# Patient Record
Sex: Male | Born: 1970 | Race: White | Hispanic: No | Marital: Married | Smoking: Never smoker
Health system: Southern US, Academic
[De-identification: ages and names within clinical notes are randomized; demographics above are authoritative.]

## PROBLEM LIST (undated history)

## (undated) DIAGNOSIS — F411 Generalized anxiety disorder: Secondary | ICD-10-CM

## (undated) DIAGNOSIS — I10 Essential (primary) hypertension: Secondary | ICD-10-CM

## (undated) DIAGNOSIS — M109 Gout, unspecified: Secondary | ICD-10-CM

## (undated) DIAGNOSIS — E7800 Pure hypercholesterolemia, unspecified: Secondary | ICD-10-CM

## (undated) DIAGNOSIS — R194 Change in bowel habit: Secondary | ICD-10-CM

## (undated) HISTORY — DX: Change in bowel habit: R19.4

## (undated) HISTORY — DX: Generalized anxiety disorder: F41.1

## (undated) HISTORY — PX: WRIST SURGERY: SHX841

---

## 1994-12-03 ENCOUNTER — Other Ambulatory Visit (HOSPITAL_COMMUNITY): Payer: Self-pay

## 2017-11-10 DIAGNOSIS — M1A09X0 Idiopathic chronic gout, multiple sites, without tophus (tophi): Secondary | ICD-10-CM | POA: Insufficient documentation

## 2021-08-09 ENCOUNTER — Ambulatory Visit (INDEPENDENT_AMBULATORY_CARE_PROVIDER_SITE_OTHER): Payer: Self-pay | Admitting: NURSE PRACTITIONER

## 2021-08-11 ENCOUNTER — Other Ambulatory Visit: Payer: Self-pay

## 2021-08-11 ENCOUNTER — Emergency Department
Admission: EM | Admit: 2021-08-11 | Discharge: 2021-08-11 | Disposition: A | Discharge status: Home / Self Care | Payer: Commercial Managed Care - PPO | Attending: Family | Admitting: Family

## 2021-08-11 ENCOUNTER — Encounter (HOSPITAL_COMMUNITY): Payer: Self-pay | Admitting: Family

## 2021-08-11 ENCOUNTER — Emergency Department (HOSPITAL_COMMUNITY): Payer: Commercial Managed Care - PPO

## 2021-08-11 DIAGNOSIS — R109 Unspecified abdominal pain: Secondary | ICD-10-CM | POA: Insufficient documentation

## 2021-08-11 DIAGNOSIS — K760 Fatty (change of) liver, not elsewhere classified: Secondary | ICD-10-CM | POA: Insufficient documentation

## 2021-08-11 DIAGNOSIS — Z8719 Personal history of other diseases of the digestive system: Secondary | ICD-10-CM

## 2021-08-11 DIAGNOSIS — K649 Unspecified hemorrhoids: Secondary | ICD-10-CM | POA: Insufficient documentation

## 2021-08-11 HISTORY — DX: Pure hypercholesterolemia, unspecified: E78.00

## 2021-08-11 HISTORY — DX: Essential (primary) hypertension: I10

## 2021-08-11 HISTORY — DX: Gout, unspecified: M10.9

## 2021-08-11 LAB — URINALYSIS, MACROSCOPIC
BILIRUBIN: NEGATIVE mg/dL
BLOOD: 0.1 mg/dL — AB
GLUCOSE: NEGATIVE mg/dL
KETONES: NEGATIVE mg/dL
LEUKOCYTES: NEGATIVE WBCs/uL
NITRITE: NEGATIVE
PH: 5 (ref 5.0–9.0)
PROTEIN: NEGATIVE mg/dL
SPECIFIC GRAVITY: 1.018 (ref 1.002–1.030)
UROBILINOGEN: NORMAL mg/dL

## 2021-08-11 LAB — CBC WITH DIFF
BASOPHIL #: 0 10*3/uL (ref 0.00–0.30)
BASOPHIL %: 0 % (ref 0–3)
EOSINOPHIL #: 0.3 10*3/uL (ref 0.00–0.80)
EOSINOPHIL %: 4 % (ref 0–7)
HCT: 42.6 % (ref 42.0–51.0)
HGB: 14.5 g/dL (ref 13.5–18.0)
LYMPHOCYTE #: 1.8 10*3/uL (ref 1.10–5.00)
LYMPHOCYTE %: 20 % — ABNORMAL LOW (ref 25–45)
MCH: 29.9 pg (ref 27.0–32.0)
MCHC: 34 g/dL (ref 32.0–36.0)
MCV: 88.1 fL (ref 78.0–99.0)
MONOCYTE #: 0.6 10*3/uL (ref 0.00–1.30)
MONOCYTE %: 7 % (ref 0–12)
MPV: 7.9 fL (ref 7.4–10.4)
NEUTROPHIL #: 6.3 10*3/uL (ref 1.80–8.40)
NEUTROPHIL %: 70 % (ref 40–76)
PLATELETS: 236 10*3/uL (ref 140–440)
RBC: 4.84 10*6/uL (ref 4.20–6.00)
RDW: 13.3 % (ref 11.6–14.8)
WBC: 9 10*3/uL (ref 4.0–10.5)
WBCS UNCORRECTED: 9 10*3/uL

## 2021-08-11 LAB — COMPREHENSIVE METABOLIC PANEL, NON-FASTING
ALBUMIN/GLOBULIN RATIO: 2.1 — ABNORMAL HIGH (ref 0.8–1.4)
ALBUMIN: 4.5 g/dL (ref 3.5–5.7)
ALKALINE PHOSPHATASE: 72 U/L (ref 34–104)
ALT (SGPT): 29 U/L (ref 7–52)
ANION GAP: 9 mmol/L — ABNORMAL LOW (ref 10–20)
AST (SGOT): 21 U/L (ref 13–39)
BILIRUBIN TOTAL: 1.3 mg/dL — ABNORMAL HIGH (ref 0.3–1.2)
BUN/CREA RATIO: 17 (ref 6–22)
BUN: 18 mg/dL (ref 7–25)
CALCIUM, CORRECTED: 8.8 mg/dL — ABNORMAL LOW (ref 8.9–10.8)
CALCIUM: 9.3 mg/dL (ref 8.6–10.3)
CHLORIDE: 108 mmol/L — ABNORMAL HIGH (ref 98–107)
CO2 TOTAL: 22 mmol/L (ref 21–31)
CREATININE: 1.08 mg/dL (ref 0.60–1.30)
ESTIMATED GFR: 85 mL/min/{1.73_m2} (ref >59)
GLOBULIN: 2.1 — ABNORMAL LOW (ref 2.9–5.4)
GLUCOSE: 96 mg/dL (ref 74–109)
OSMOLALITY, CALCULATED: 279 mOsm/kg (ref 270–290)
POTASSIUM: 4.2 mmol/L (ref 3.5–5.1)
PROTEIN TOTAL: 6.6 g/dL (ref 6.4–8.9)
SODIUM: 139 mmol/L (ref 136–145)

## 2021-08-11 LAB — GRAY TOP TUBE

## 2021-08-11 LAB — LAVENDER TOP TUBE

## 2021-08-11 LAB — URINALYSIS, MICROSCOPIC: RBCS: <1 /hpf (ref <4)

## 2021-08-11 LAB — GOLD TOP TUBE

## 2021-08-11 LAB — BLUE TOP TUBE

## 2021-08-11 LAB — LIPASE: LIPASE: 25 U/L (ref 11–82)

## 2021-08-11 NOTE — ED Provider Notes (Signed)
[facility]  ED Primary Provider Note  History of Present Illness   Chief Complaint   Patient presents with   . Abdominal Pain     Arrival: The patient arrived by Car    Greg Booth is a 51 y.o. male who had concerns including Abdominal Pain.  51 year old male presents to the emergency room for evaluation of right-sided abdominal pain x7 days.  Patient also notes he has had multiple loose stool per day.  States he did have a small amount of bright red blood on the toilet paper and feels that is due to hemorrhoids.  Nothing seems to make signs and symptoms better or worse.  Patient denies fever, chills, dysuria, black stool, nausea, and vomiting.  He has no complaints of flank pain or radiating pain.  He describes the abdominal pain as a dull ache.  He has no other complaints this time.  Pain is currently rated at 4/10    Review of Systems   All other systems reviewed and are negative except as noted.    Historical Data   History Reviewed This Encounter: Medical History  Surgical History  Family History  Social History      Physical Exam   ED Triage Vitals [08/11/21 0811]   BP (Non-Invasive) (!) 139/100   Heart Rate 75   Respiratory Rate 18   Temperature 36.4 C (97.5 F)   SpO2 98 %   Weight 104 kg (230 lb)   Height 1.727 m (5' 8")       Constitutional:  51 y.o. male who appears in no distress. Normal color, no cyanosis.   HENT:   Head: Normocephalic and atraumatic.   Mouth/Throat: Oropharynx is clear and moist.   Eyes: EOMI, PERRL   Neck: Trachea midline. Neck supple.  Cardiovascular: RRR, S1, S2.  No murmurs, rubs or gallops. Intact distal pulses.  Pulmonary/Chest: BS clear and equal bilaterally.  Respirations even and nonlabored.  No respiratory distress. No wheezes, rales or chest tenderness.   Abdominal: Bowel sounds present and normal. Abdomen soft,  no rebound and no guarding. Mild right side abd pain. Hemorrhoid noted on examination, small with no active bleeding. RN,  Wells Guiles, chaperone at bedside  Back: No midline spinal tenderness, no paraspinal tenderness, no CVA tenderness.           Musculoskeletal: No edema, tenderness or deformity.  Skin: warm and dry. No rash, erythema, pallor or cyanosis  Psychiatric: normal mood and affect. Behavior is normal.   Neurological: Patient keenly alert and responsive, easily able to raise eyebrows, facial muscles/expressions symmetric, speaking in fluent sentences, moving all extremities equally and fully, normal gait  Patient Data     Labs Ordered/Reviewed   COMPREHENSIVE METABOLIC PANEL, NON-FASTING - Abnormal; Notable for the following components:       Result Value    CHLORIDE 108 (*)     ANION GAP 9 (*)     BILIRUBIN TOTAL 1.3 (*)     ALBUMIN/GLOBULIN RATIO 2.1 (*)     CALCIUM, CORRECTED 8.8 (*)     GLOBULIN 2.1 (*)     All other components within normal limits    Narrative:     Estimated Glomerular Filtration Rate (eGFR) is calculated using the CKD-EPI (2021) equation, intended for patients 41 years of age and older. If gender is not documented or "unknown", there will be no eGFR calculation.   CBC WITH DIFF - Abnormal; Notable for the following components:  LYMPHOCYTE % 20 (*)     All other components within normal limits   URINALYSIS, MACROSCOPIC - Abnormal; Notable for the following components:    BLOOD 0.1 (*)     All other components within normal limits   URINALYSIS, MICROSCOPIC - Abnormal; Notable for the following components:    MUCOUS Rare (*)     All other components within normal limits   LIPASE - Normal   CBC/DIFF    Narrative:     The following orders were created for panel order CBC/DIFF.  Procedure                               Abnormality         Status                     ---------                               -----------         ------                     CBC WITH DIFF[[id number]]                Abnormal            Final result                 Please view results for these tests on the individual orders.    URINALYSIS, MACROSCOPIC AND MICROSCOPIC W/CULTURE REFLEX    Narrative:     The following orders were created for panel order URINALYSIS, MACROSCOPIC AND MICROSCOPIC W/CULTURE REFLEX.  Procedure                               Abnormality         Status                     ---------                               -----------         ------                     URINALYSIS, MACROSCOPIC[[id number]]      Abnormal            Final result               URINALYSIS, MICROSCOPIC[[id number]]      Abnormal            Final result                 Please view results for these tests on the individual orders.   EXTRA TUBES    Narrative:     The following orders were created for panel order EXTRA TUBES.  Procedure                               Abnormality         Status                     ---------                               -----------         ------  BLUE TOP ZOXW[[id number]]                                    In process                 GOLD TOP JXBJ[[id number]]                                    In process                 LAVENDER TOP HYQM[[id number]]                                In process                 Bolan                                    In process                   Please view results for these tests on the individual orders.   BLUE TOP TUBE   GOLD TOP TUBE   LAVENDER TOP TUBE   GRAY TOP TUBE     CT ABDOMEN PELVIS WO IV CONTRAST   Final Result by Edi, Radresults In (05/27 0923)   NO ACUTE FINDINGS AT THE ABDOMEN OR PELVIS ON NONCONTRAST CT.          One or more dose reduction techniques were used (e.g., Automated exposure control, adjustment of the mA and/or kV according to patient size, use of iterative reconstruction technique).         Radiologist location ID: [place] [facility]        Medical Decision Making  Will discharge patient home due to no significant/urgent findings requiring admission.  CT shows fatty liver, patient has bili of 1.3.  He has  no elevation white blood cell count, no anemia, and normal kidney function and electrolytes.  Patient was instructed to follow-up with his primary care doctor in 2-3 days for recheck.  We did discuss preventative diagnostics to as well such as colonoscopy.  Patient notified to avoid taking Tylenol regularly at home and to only use if needed    Fatty liver: acute illness or injury  Hemorrhoids, unspecified hemorrhoid type: acute illness or injury  Right lateral abdominal pain: acute illness or injury  Amount and/or Complexity of Data Reviewed  Labs: ordered. Decision-making details documented in ED Course.  Radiology: ordered. Decision-making details documented in ED Course.      Critical Care  Total time providing critical care: 0 minutes    ED Course as of 08/11/21 1127   Sat Aug 11, 2021   1041 Pending CMP and lipase for disposition.            Clinical Impression   Right lateral abdominal pain (Primary)   Fatty liver   Hemorrhoids, unspecified hemorrhoid type       Disposition: Discharged

## 2021-08-11 NOTE — ED Nurses Note (Signed)
Rounding completed at this time. Patient updated on plan of care. All questions and concerns acknowledged and answered. Patient resting comfortably, denies any needs or complaints at this time. Call light within reach.

## 2021-08-11 NOTE — ED Nurses Note (Signed)
Patient medically evaluated and deemed appropriate for discharge by provider. AVS reviewed with patient/caregiver and copy of all instructions given. Questions sufficiently answered as needed. Patient acknowledged understanding of all instructions. Patient encouraged to follow up with PCP as indicated. In the event of an emergency, patient instructed to call 911 or go to the nearest emergency room. All belongings in patient/caregivers possession at time of discharge. Patient leaving unit ambulatory.

## 2021-08-11 NOTE — Discharge Instructions (Addendum)
FOLLOW UP WITH THE PRIMARY CARE PROVIDER AS SOON AS POSSIBLE BUT NO LATER THAN 3 DAYS NOW.  IF NO PRIMARY CARE PROVIDER EXISTS, THEN THE PATIENT IS INSTRUCTED TO ESTABLISH CARE WITH A PRIMARY CARE PROVIDER. FOLLOW-UP WITH ANY SPECIALIST PROVIDER AS INDICATED AS SOON AS POSSIBLE BUT NO LATER THAN 3 DAYS.  NOTIFY THE PRIMARY CARE PROVIDER THAT YOU WERE IN THE EMERGENCY DEPARTMENT WITHIN 24 HOURS OF DISCHARGE TO FOLLOW-UP ON YOUR RESULTS AND/OR TREATMENTS.  RETURN TO THE EMERGENCY DEPARTMENT IMMEDIATELY IF NEEDED, NO BETTER, WORSE, NEW SYMPTOMS ARISE, OR YOU CANNOT FOLLOW-UP WITH YOUR PRIMARY CARE PROVIDER IN THE PRESCRIBED TIMEFRAME.

## 2021-08-11 NOTE — ED Triage Notes (Signed)
RLQ  Pain x5 days increased to constant pain since last night with loose stools.

## 2021-08-16 ENCOUNTER — Encounter (INDEPENDENT_AMBULATORY_CARE_PROVIDER_SITE_OTHER): Payer: Self-pay | Admitting: Internal Medicine

## 2021-08-16 ENCOUNTER — Other Ambulatory Visit: Payer: Self-pay

## 2021-08-16 ENCOUNTER — Ambulatory Visit (INDEPENDENT_AMBULATORY_CARE_PROVIDER_SITE_OTHER): Payer: Self-pay | Admitting: NURSE PRACTITIONER

## 2021-08-16 ENCOUNTER — Ambulatory Visit (INDEPENDENT_AMBULATORY_CARE_PROVIDER_SITE_OTHER): Payer: Commercial Managed Care - PPO | Admitting: Internal Medicine

## 2021-08-16 VITALS — BP 138/72 | HR 74 | Resp 18 | Ht 68.0 in | Wt 232.4 lb

## 2021-08-16 DIAGNOSIS — K760 Fatty (change of) liver, not elsewhere classified: Secondary | ICD-10-CM | POA: Insufficient documentation

## 2021-08-16 DIAGNOSIS — R194 Change in bowel habit: Secondary | ICD-10-CM

## 2021-08-16 DIAGNOSIS — F411 Generalized anxiety disorder: Secondary | ICD-10-CM | POA: Insufficient documentation

## 2021-08-16 DIAGNOSIS — E7800 Pure hypercholesterolemia, unspecified: Secondary | ICD-10-CM | POA: Insufficient documentation

## 2021-08-16 DIAGNOSIS — I10 Essential (primary) hypertension: Secondary | ICD-10-CM

## 2021-08-16 DIAGNOSIS — R319 Hematuria, unspecified: Secondary | ICD-10-CM

## 2021-08-16 DIAGNOSIS — M109 Gout, unspecified: Secondary | ICD-10-CM | POA: Insufficient documentation

## 2021-08-16 MED ORDER — RAMIPRIL 10 MG CAPSULE
10.0000 mg | ORAL_CAPSULE | Freq: Two times a day (BID) | ORAL | 2 refills | Status: DC
Start: 2021-08-16 — End: 2021-11-14

## 2021-08-16 NOTE — Progress Notes (Signed)
INTERNAL MEDICINE, [facility]  [address]  [place] [address]       Name: Greg Booth MRN:  [id number]   Date: 08/16/2021 Age: 51 y.o.       Chief Complaint:    Chief Complaint   Patient presents with   . Emergency Room Follow Up     Pt was seen in [facility] er for abdominal pain test done everything was normal labs ct scan         HPI:  Greg Booth is a 51 y.o. male who presents to the office today via follow-up from his visit with abdominal pain in the emergency department.  He is having abdominal pain he describes as along the right side of his abdomen and it is a dull aching sensation that is intermittent in nature.  Denied any fevers or chills associated with this he denies any chest pain or chest pressure no cough with productive sputum or nausea or vomiting.  He denies any melena but he does have some bleeding whenever he wipes and he states that he has some external hemorrhoids in the past and thinks that this is just secondary to some bowel habit changes that he is had over the last 2-3 weeks.  He states that during this time he has had some more looser stool and he is unsure as to its etiology.  He denies any changes in medications with this and he denies any other issues.  He has noticed also that his blood pressures been elevated slightly as his systolic pressure tends to remain in the mid to upper 130s most of the time.  He is wanting to know if there is anything that could be done with this.  He is denying any headaches and he is otherwise asymptomatic.        Past Medical History:  Past Medical History:   Diagnosis Date   . Change in bowel habits    . Generalized anxiety disorder    . Gout, unspecified    . High cholesterol    . HTN (hypertension)          No past surgical history on file.   Current Outpatient Medications   Medication Sig   . allopurinoL (ZYLOPRIM) 300 mg Oral Tablet 400 mg daily - one tablet of 300 mg and 1 tablet of 100 mg daily   . atorvastatin  (LIPITOR) 40 mg Oral Tablet Take 1 Tablet (40 mg total) by mouth   . metoprolol succinate (TOPROL-XL) 25 mg Oral Tablet Sustained Release 24 hr Take 1 Tablet (25 mg total) by mouth   . ramipriL (ALTACE) 10 mg Oral Capsule Take 1 Capsule (10 mg total) by mouth     Allergies   Allergen Reactions   . Penicillins Rash   . Sulfa (Sulfonamides) Rash       Family History:  Family Medical History:     Problem Relation (Age of Onset)    Diabetes Mother, Father    Gout Mother, Father    Hypertension (High Blood Pressure) Mother, Father    Renal Tumor Father            Social History:   Social History     Tobacco Use   Smoking Status Never   . Passive exposure: Never   Smokeless Tobacco Never     Social History     Substance and Sexual Activity   Alcohol Use Yes    Comment: "Occasional"  Social History     Occupational History   . Not on file       Review of Systems:  Review of systems as discussed in HPI    Problem List:  Patient Active Problem List   Diagnosis   . Gout, unspecified   . High cholesterol   . HTN (hypertension)   . Generalized anxiety disorder   . 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   Constitutional:       General: He is not in acute distress.     Appearance: Normal appearance.   HENT:      Head: Normocephalic.      Right Ear: Tympanic membrane normal.      Left Ear: Tympanic membrane normal.      Nose: Nose normal.      Mouth/Throat:      Mouth: Mucous membranes are moist.   Eyes:      General: No scleral icterus.     Extraocular Movements: Extraocular movements intact.      Conjunctiva/sclera: Conjunctivae normal.      Pupils: Pupils are equal, round, and reactive to light.   Neck:      Vascular: No carotid bruit.    Cardiovascular:      Rate and Rhythm: Normal rate and regular rhythm.      Pulses: Normal pulses.           Dorsalis pedis pulses are 2+ on the right side and 2+ on the left side.        Posterior tibial pulses are 2+ on the right side and 2+ on the left side.      Heart sounds: Normal heart sounds.   Pulmonary:      Effort: Pulmonary effort is normal.      Breath sounds: Normal breath sounds. No wheezing, rhonchi or rales.   Abdominal:      General: Abdomen is flat. Bowel sounds are normal.      Tenderness: There is no abdominal tenderness. There is no guarding.   Musculoskeletal:         General: No swelling. Normal range of motion.      Cervical back: Normal range of motion. No tenderness.      Right lower leg: No edema.      Left lower leg: No edema.   Skin:     General: Skin is warm.      Capillary Refill: Capillary refill takes less than 2 seconds.   Neurological:      General: No focal deficit present.      Mental Status: He is alert and oriented to person, place, and time.      Cranial Nerves: No cranial nerve deficit.          Data Reviewed:  COMPLETE BLOOD COUNT   Lab Results   Component Value Date    WBC 9.0 08/11/2021    HGB 14.5 08/11/2021    HCT 42.6 08/11/2021    PLTCNT 236 08/11/2021       DIFFERENTIAL  Lab Results   Component Value Date    PMNS 44  08/11/2021    LYMPHOCYTES 20 (L) 08/11/2021    MONOCYTES 7 08/11/2021    EOSINOPHIL 4 08/11/2021    BASOPHILS 0 08/11/2021    BASOPHILS 0.00 08/11/2021    PMNABS 6.30 08/11/2021    LYMPHSABS 1.80 08/11/2021    EOSABS 0.30 08/11/2021    MONOSABS 0.60 08/11/2021     BASIC METABOLIC PANEL  Lab Results   Component Value Date    SODIUM 139 08/11/2021    POTASSIUM 4.2 08/11/2021    CHLORIDE 108 (H) 08/11/2021    CO2 22 08/11/2021    ANIONGAP 9 (L) 08/11/2021    BUN 18 08/11/2021    CREATININE 1.08 08/11/2021    BUNCRRATIO 17 08/11/2021    GFR 85 08/11/2021    CALCIUM 9.3 08/11/2021    GLUCOSENF 96 08/11/2021      COMPREHENSIVE METABOLIC PANEL FASTING  Lab  Results   Component Value Date    SODIUM 139 08/11/2021    POTASSIUM 4.2 08/11/2021    CHLORIDE 108 (H) 08/11/2021    CO2 22 08/11/2021    ANIONGAP 9 (L) 08/11/2021    BUN 18 08/11/2021    CREATININE 1.08 08/11/2021    CALCIUM 9.3 08/11/2021    ALBUMIN 4.5 08/11/2021    TOTALPROTEIN 6.6 08/11/2021    ALKPHOS 72 08/11/2021    AST 21 08/11/2021    ALT 29 08/11/2021     COMPREHENSIVE METABOLIC PANEL - NON FASTING  Lab Results   Component Value Date    SODIUM 139 08/11/2021    POTASSIUM 4.2 08/11/2021    CHLORIDE 108 (H) 08/11/2021    CO2 22 08/11/2021    ANIONGAP 9 (L) 08/11/2021    BUN 18 08/11/2021    CREATININE 1.08 08/11/2021    GLUCOSENF 96 08/11/2021    CALCIUM 9.3 08/11/2021    ALBUMIN 4.5 08/11/2021    TOTALPROTEIN 6.6 08/11/2021    ALKPHOS 72 08/11/2021    AST 21 08/11/2021    ALT 29 08/11/2021     THYROID STIMULATING HORMONE  No results found for: TSH, [id number]  URIC ACID  No results found for: URICACID      No results found for this or any previous visit (from the past [id number] hour(s)).      No results found for: URINECX             No results found for this or any previous visit (from the past [id number] hour(s)).  No results found for this or any previous visit (from the past [id number] hour(s)).       Health Maintenance:  Health Maintenance   Topic Date Due   . Hepatitis B Vaccine (1 of 3 - 3-dose series) Never done   . Covid-19 Vaccine (1) Never done   . HIV Screening  Never done   . Adult Tdap-Td (1 - Tdap) Never done   . Colonoscopy  Never done   . Annual Wellness Visit  Never done   . Influenza Vaccine (Season Ended) 11/16/2021   . Depression Screening  08/17/2022   . Meningococcal Vaccine  Aged Out   . Pneumococcal Vaccine, Age 64-64  Aged Out        Assessment:      ICD-10-CM    1. Change in bowel habits  R19.4       2. Gout of multiple sites, unspecified cause, unspecified chronicity  M10.9       3. High cholesterol  E78.00       4. Primary hypertension  I10       5. Generalized anxiety disorder   F41.1       6. 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  Otherwise his labs were essentially negative.  It does have a very minimally elevated bilirubin 1.3 and this may be the culprit for his microscopic hematuria that was seen on dipstick.  The problem is that he is having a changing in his bowel habits and the fact that he is having some abdominal pain in the CT scan of the abdomen and pelvis performed in the emergency department was essentially negative I am going to send him to surgery for a colonoscopy.  He will continue with his current treatment plans otherwise except I am going to increase his ramipril to 10 mg twice daily and see if this will help with his underlying systolic hypertension.  I have told him that we will follow-up with him in about 3 months and I am going to order a repeat urinalysis and the order has been sent and he is aware to go to the hospital to get this collected to be sure that the hematuria has resolved.    No orders of the defined types were placed in this encounter.    Follow up:  No follow-ups on file.    This note was partially created using voice recognition software and is inherently subject to errors including those of syntax and "sound alike " substitutions which may escape proof reading.  In such instances, original meaning may be extrapolated by contextual derivation.    Lucia Gaskins, DO  08/16/2021, 10:37

## 2021-08-31 ENCOUNTER — Encounter (INDEPENDENT_AMBULATORY_CARE_PROVIDER_SITE_OTHER): Payer: Self-pay | Admitting: Family

## 2021-08-31 ENCOUNTER — Telehealth: Payer: 59 | Admitting: Family

## 2021-08-31 DIAGNOSIS — J329 Chronic sinusitis, unspecified: Secondary | ICD-10-CM

## 2021-08-31 MED ORDER — PROMETHAZINE 6.25 MG/5 ML ORAL SYRUP
6.2500 mg | ORAL_SOLUTION | Freq: Four times a day (QID) | ORAL | 0 refills | Status: DC | PRN
Start: 2021-08-31 — End: 2021-10-03

## 2021-08-31 MED ORDER — LEVOFLOXACIN 500 MG TABLET
500.0000 mg | ORAL_TABLET | Freq: Every day | ORAL | 0 refills | Status: DC
Start: 2021-08-31 — End: 2021-10-03

## 2021-08-31 MED ORDER — PREDNISONE 20 MG TABLET
20.0000 mg | ORAL_TABLET | Freq: Every day | ORAL | 0 refills | Status: DC
Start: 2021-08-31 — End: 2021-09-06

## 2021-08-31 NOTE — Progress Notes (Signed)
[facility], [facility]  [address]  [place] [address]    Telephone Visit    Name:  Greg Booth MRN: [id number]   Date:  08/31/2021 Age:   51 y.o.     The patient/family initiated a request for telephone service.  Verbal consent for this service was obtained from the patient/family.    Last office visit in this department: 08/16/2021      Reason for call: sinus pressure and congestion  Call notes:  Having complaints of sinus pressure and congestion since Saturday.  No fever.  Also having nose bleeds which has resolved now.  Drainage is thick yellow.  Some cough.      ICD-10-CM    1. Sinusitis, unspecified chronicity, unspecified location  J32.9           Total provider time spent with the patient on the phone 5 minutes.    Henderson Baltimore, FNP-BC

## 2021-09-06 ENCOUNTER — Telehealth (INDEPENDENT_AMBULATORY_CARE_PROVIDER_SITE_OTHER): Payer: Self-pay | Admitting: Family

## 2021-09-06 ENCOUNTER — Other Ambulatory Visit (INDEPENDENT_AMBULATORY_CARE_PROVIDER_SITE_OTHER): Payer: Self-pay | Admitting: Family

## 2021-09-06 DIAGNOSIS — J069 Acute upper respiratory infection, unspecified: Secondary | ICD-10-CM

## 2021-09-06 MED ORDER — DOXYCYCLINE HYCLATE 100 MG TABLET
100.0000 mg | ORAL_TABLET | Freq: Two times a day (BID) | ORAL | 0 refills | Status: DC
Start: 2021-09-06 — End: 2021-10-03

## 2021-09-06 MED ORDER — PREDNISONE 20 MG TABLET
20.0000 mg | ORAL_TABLET | Freq: Every day | ORAL | 0 refills | Status: DC
Start: 2021-09-06 — End: 2021-10-03

## 2021-09-06 NOTE — Telephone Encounter (Signed)
Pt is not better. What to do?

## 2021-09-11 ENCOUNTER — Ambulatory Visit (INDEPENDENT_AMBULATORY_CARE_PROVIDER_SITE_OTHER): Payer: 59 | Admitting: Surgery

## 2021-09-11 ENCOUNTER — Encounter (INDEPENDENT_AMBULATORY_CARE_PROVIDER_SITE_OTHER): Payer: Self-pay | Admitting: Surgery

## 2021-09-11 ENCOUNTER — Other Ambulatory Visit: Payer: Self-pay

## 2021-09-11 VITALS — BP 111/59 | HR 88 | Temp 97.7°F | Resp 18 | Ht 68.0 in | Wt 233.0 lb

## 2021-09-11 DIAGNOSIS — Z1211 Encounter for screening for malignant neoplasm of colon: Secondary | ICD-10-CM

## 2021-09-11 MED ORDER — SUTAB 1.479-0.188-0.225 GRAM TABLET
ORAL_TABLET | ORAL | 0 refills | Status: DC
Start: 2021-09-11 — End: 2021-10-03

## 2021-09-13 NOTE — Patient Instructions (Signed)
Screening colonoscopy 

## 2021-09-13 NOTE — H&P (Signed)
GENERAL SURGERY, [facility]  [address]  [place] [address]    History and Physical     Name: Greg Booth MRN:  [id number]   Date: 09/11/2021 Age: 51 y.o.            Reason for Visit: Colonoscopy (Screening colon )    History of Present Illness  Greg Booth presents today for screening colonoscopy.  The patient was seen in the emergency room and a CT scan of the abdomen was performed which showed fatty liver.  The patient denies any issues with constipation whereas on occasion he may have some loose bowel movements but nothing significant.  The patient does have history of hemorrhoids.    Negative diabetes, blood thinner, family history of colon cancer      Review of the result(s) of each unique test:  Patient underwent diagnostic testing ( CT scan as above ) prior to this dates visit.  I have personally reviewed the results and that serves as a component of the medical decision making for this encounter       Review of prior external note(s) from each unique source:  Patients referral to this office including a recent assessment by the referring provider.  This was reviewed by me for this unique office visit for the indication and intent of the referral as well as any pertinent medical or surgical history relevant to the patients independent evaluation by me today.      Patient History  Past Medical History:   Diagnosis Date   . Change in bowel habits    . Generalized anxiety disorder    . Gout, unspecified    . High cholesterol    . HTN (hypertension)          Past Surgical History was reviewed and is negative for Any other medical illnesses      Current Outpatient Medications   Medication Sig   . allopurinoL (ZYLOPRIM) 300 mg Oral Tablet 400 mg daily - one tablet of 300 mg and 1 tablet of 100 mg daily   . atorvastatin (LIPITOR) 40 mg Oral Tablet Take 1 Tablet (40 mg total) by mouth   . doxycycline 100 mg Oral Tablet Take 1 Tablet (100 mg total) by mouth Twice daily   .  levoFLOXacin (LEVAQUIN) 500 mg Oral Tablet Take 1 Tablet (500 mg total) by mouth Once a day   . metoprolol succinate (TOPROL-XL) 25 mg Oral Tablet Sustained Release 24 hr Take 1 Tablet (25 mg total) by mouth   . predniSONE (DELTASONE) 20 mg Oral Tablet Take 1 Tablet (20 mg total) by mouth Once a day   . promethazine (PHENERGAN) 6.25 mg/5 mL Oral Syrup Take 5 mL (6.25 mg total) by mouth Every 6 hours as needed for Nausea/Vomiting   . ramipriL (ALTACE) 10 mg Oral Capsule Take 1 Capsule (10 mg total) by mouth Twice daily   . sod sulf-pot chloride-mag sulf (SUTAB) 1.479-0.188- 0.225 gram Oral Tablet Take 12 pills at 10 am to 10:30 am. Take 12 pills at 6 pm to 6:30 pm.     Allergies   Allergen Reactions   . Penicillins Rash   . Sulfa (Sulfonamides) Rash     Family Medical History:     Problem Relation (Age of Onset)    Diabetes Mother, Father    Gout Mother, Father    Hypertension (High Blood Pressure) Mother, Father    Renal Tumor Father  Social History     Tobacco Use   . Smoking status: Never     Passive exposure: Never   . Smokeless tobacco: Never   Vaping Use   . Vaping Use: Never used   Substance Use Topics   . Alcohol use: Yes     Comment: "Occasional"   . Drug use: Never            Physical Examination:  Vitals:    09/11/21 1459   BP: (!) 111/59   Pulse: 88   Resp: 18   Temp: 36.5 C (97.7 F)   SpO2: 97%   Weight: 106 kg (233 lb)   Height: 1.727 m (5\' 8" )   BMI: 35.5        General: appropriate for age. in no acute distress.    Vital signs are present above and have been reviewed by me     HEENT: Atraumatic, Normocephalic. PERRLA. EOMI. Nose clear. Throat clear    Lungs: Nonlabored breathing with symmetric expansion. Clear to auscultation bilaterally    Heart:Regular wth respect to rate and rythmn.    Abdomen:Soft. Nontender. Nondistended and benign    Extremities: Grossly normal. No major deformities     Neuro:  Grossly normal motor and sensory function    Psychiatric: Alert and oriented to person,  place, and time. affect appropriate      Assessment and Plan  Colonoscopy scheduled for 11/28/2021 at 12:00 p.m. noon      Follow Up:  No follow-ups on file.      ICD-10-CM    1. Encounter for screening colonoscopy  Z12.11           Kacee Sukhu B Milano Rosevear, MD ,MBA,FACS    I appreciate the opportunity to be involved in the care of your patients.  If you have any questions or concerns regarding this encounter, please do not hesitate to contact me at your convenience.      This note may have been partially generated using MModal Fluency Direct system, and there may be some incorrect words, spellings, and punctuation that were not noted in checking the note before saving, though effort was made to avoid such errors.

## 2021-10-03 ENCOUNTER — Other Ambulatory Visit: Payer: Self-pay

## 2021-10-03 ENCOUNTER — Ambulatory Visit (INDEPENDENT_AMBULATORY_CARE_PROVIDER_SITE_OTHER): Payer: 59 | Admitting: Family

## 2021-10-03 ENCOUNTER — Encounter (INDEPENDENT_AMBULATORY_CARE_PROVIDER_SITE_OTHER): Payer: Self-pay | Admitting: Family

## 2021-10-03 VITALS — BP 112/76 | HR 74 | Ht 68.0 in | Wt 236.0 lb

## 2021-10-03 DIAGNOSIS — R1011 Right upper quadrant pain: Secondary | ICD-10-CM

## 2021-10-03 DIAGNOSIS — K760 Fatty (change of) liver, not elsewhere classified: Secondary | ICD-10-CM

## 2021-10-03 DIAGNOSIS — L739 Follicular disorder, unspecified: Secondary | ICD-10-CM

## 2021-10-03 MED ORDER — DOXYCYCLINE HYCLATE 100 MG CAPSULE
100.0000 mg | ORAL_CAPSULE | Freq: Two times a day (BID) | ORAL | 0 refills | Status: DC
Start: 2021-10-03 — End: 2022-04-02

## 2021-10-03 NOTE — Progress Notes (Signed)
[facility], [facility]  [address]  [place] [address]       Name: Greg Booth MRN:  [id number]   Date: 10/03/2021 Age: 51 y.o.       Chief Complaint:    Chief Complaint   Patient presents with   . Abdominal Pain     Stomach pain on the right side, was seen in the ER about a month ago. He is having a colonoscopy in September. Said the pain has moved more to under his ribs.  York Spaniel this is a different pain.          HPI:  Greg Booth is a 51 y.o. male who presents with complaints of ruq pain for the alst 3-4 months.  Was seen in the er and had a ct.  No nausea, vomiting or change in his bowels.  Has seen dr duremdes and will have a colonoscopy in September.  Having issues with folliculitis.          Past Medical History:  Past Medical History:   Diagnosis Date   . Change in bowel habits    . Generalized anxiety disorder    . Gout, unspecified    . High cholesterol    . HTN (hypertension)          History reviewed. No pertinent surgical history.   Current Outpatient Medications   Medication Sig   . allopurinoL (ZYLOPRIM) 300 mg Oral Tablet 400 mg daily - one tablet of 300 mg and 1 tablet of 100 mg daily   . atorvastatin (LIPITOR) 40 mg Oral Tablet Take 1 Tablet (40 mg total) by mouth   . doxycycline hyclate (VIBRAMYCIN) 100 mg Oral Capsule Take 1 Capsule (100 mg total) by mouth Twice daily   . metoprolol succinate (TOPROL-XL) 25 mg Oral Tablet Sustained Release 24 hr Take 1 Tablet (25 mg total) by mouth   . ramipriL (ALTACE) 10 mg Oral Capsule Take 1 Capsule (10 mg total) by mouth Twice daily     Allergies   Allergen Reactions   . Penicillins Rash   . Sulfa (Sulfonamides) Rash       Family History:  Family Medical History:     Problem Relation (Age of Onset)    Diabetes Mother, Father    Gout Mother, Father    Hypertension (High Blood Pressure) Mother, Father    Renal Tumor Father            Social History:   Social History     Tobacco Use   Smoking Status Never   . Passive  exposure: Never   Smokeless Tobacco Never     Social History     Substance and Sexual Activity   Alcohol Use Yes    Comment: "Occasional"     Social History     Occupational History   . Not on file       Review of Systems:  Review of systems as discussed in HPI    Problem List:  Patient Active Problem List   Diagnosis   . Gout, unspecified   . High cholesterol   . HTN (hypertension)   . Generalized anxiety disorder   . Nonalcoholic fatty liver disease without nonalcoholic steatohepatitis (NASH)       Physical Examination:  BP 112/76   Pulse 74   Ht 1.727 m (5\' 8" )   Wt 107 kg (236 lb)   SpO2 97%   BMI 35.88 kg/m  Physical Exam  Constitutional:       General: He is not in acute distress.     Appearance: Normal appearance. He is normal weight. He is not diaphoretic.   HENT:      Head: Normocephalic.      Right Ear: Tympanic membrane normal.      Left Ear: Tympanic membrane normal.      Nose: Nose normal.      Mouth/Throat:      Mouth: Mucous membranes are moist.      Pharynx: Oropharynx is clear.   Eyes:      Pupils: Pupils are equal, round, and reactive to light.   Cardiovascular:      Rate and Rhythm: Normal rate and regular rhythm.   Pulmonary:      Breath sounds: Normal breath sounds.   Abdominal:      General: Bowel sounds are normal.      Palpations: Abdomen is soft.   Musculoskeletal:         General: Normal range of motion.      Cervical back: Normal range of motion and neck supple.   Skin:     General: Skin is warm and dry.   Neurological:      Mental Status: He is alert.   Psychiatric:         Mood and Affect: Mood normal.         Behavior: Behavior normal.         Thought Content: Thought content normal.         Judgment: Judgment normal.        Health Maintenance:  Health Maintenance   Topic Date Due   . Hepatitis B Vaccine (1 of 3 - 3-dose series) Never done   . Covid-19 Vaccine (1) Never done   . HIV Screening  Never done   . Adult Tdap-Td (1 - Tdap) Never done   . Colonoscopy  Never done   .  Influenza Vaccine (1) 11/16/2021   . Depression Screening  08/17/2022   . Meningococcal Vaccine  Aged Out   . Pneumococcal Vaccine, Age 21-64  Aged Out        Assessment:    ICD-10-CM    1. RUQ pain  R10.11 US GALLBLADDER     NUC CHOLESCINTIGRAPHY HEPATO IMAGING W EF      2. Nonalcoholic fatty liver disease without nonalcoholic steatohepatitis (NASH)  K76.0 US LIVER      3. Folliculitis  L73.9            Plan:  Will obtain u/s and HIDA for ruq pain.  Will obtain liver u/s to assess fatty liver.  Will try doxycycline for folliculitis.    Orders Placed This Encounter   . US GALLBLADDER   . NUC CHOLESCINTIGRAPHY HEPATO IMAGING W EF   . US LIVER   . doxycycline hyclate (VIBRAMYCIN) 100 mg Oral Capsule              Follow up:  Return as scheduled.    This note was partially created using voice recognition software and is inherently subject to errors including those of syntax and "sound alike " substitutions which may escape proof reading.  In such instances, original meaning may be extrapolated by contextual derivation.    [address] [place], FNP-BC  10/03/2021, 15:24

## 2021-10-04 NOTE — Addendum Note (Signed)
Addended by: Jeannine Boga on: 10/04/2021 12:16 PM     Modules accepted: Orders

## 2021-10-05 ENCOUNTER — Telehealth (HOSPITAL_COMMUNITY): Payer: Self-pay | Admitting: Internal Medicine

## 2021-10-05 NOTE — Telephone Encounter (Signed)
[name] NOTIFIED PT OF ABDOMEN US COMPLETE AT [facility] ON 10/22/2021 AT 9:30 AND HIDA SCAN AT [facility] ON 10/24/2021 AT 9  FAXED AND MAILED  MELISSA

## 2021-10-15 ENCOUNTER — Other Ambulatory Visit: Payer: Self-pay

## 2021-10-22 IMAGING — US US ABDOMEN COMPLETE
1 series · 14 of 25 positions shown · non-contrast
Comparison: None available.

﻿EXAM:  06033   US ABDOMEN COMPLETE
INDICATION: Right upper quadrant abdominal pain.

[Series 1: us abdomen complete · 14 of 60 slices shown]
[im 1/60]
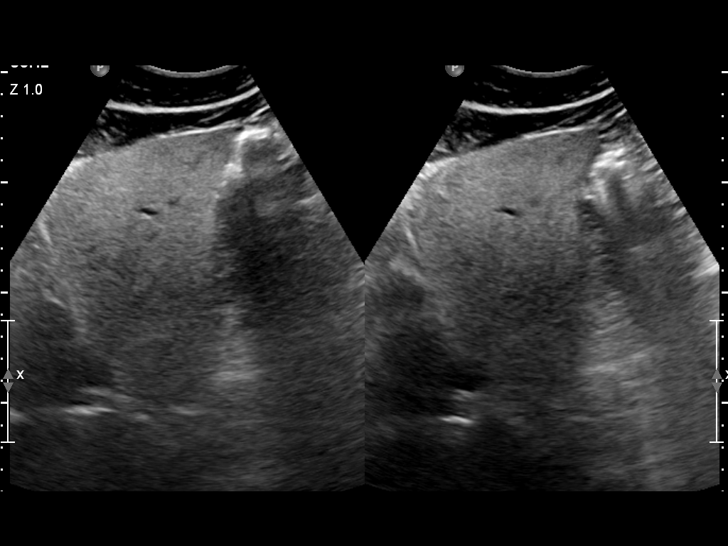
[im 5/60]
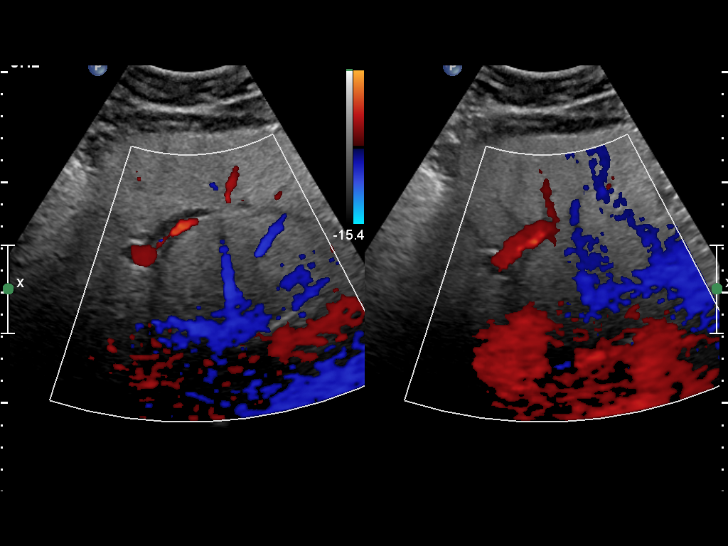
[im 10/60]
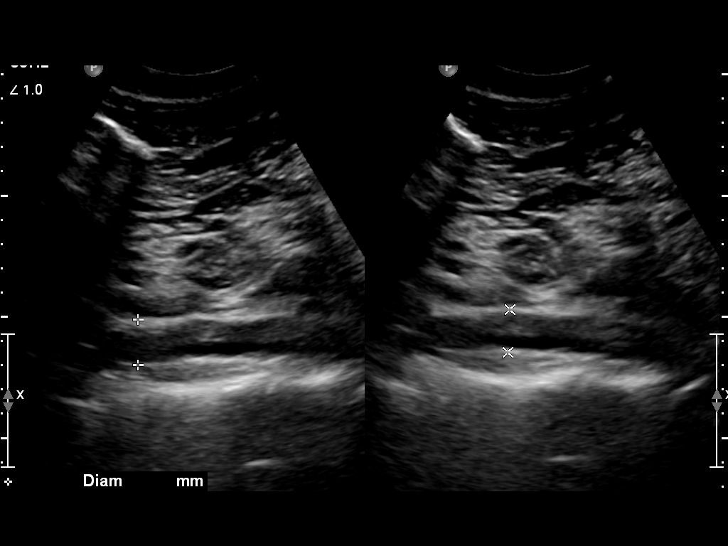
[im 15/60]
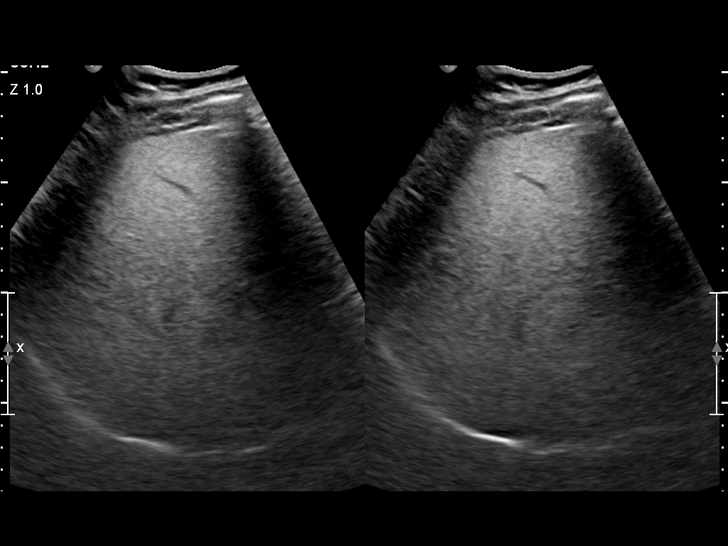
[im 20/60]
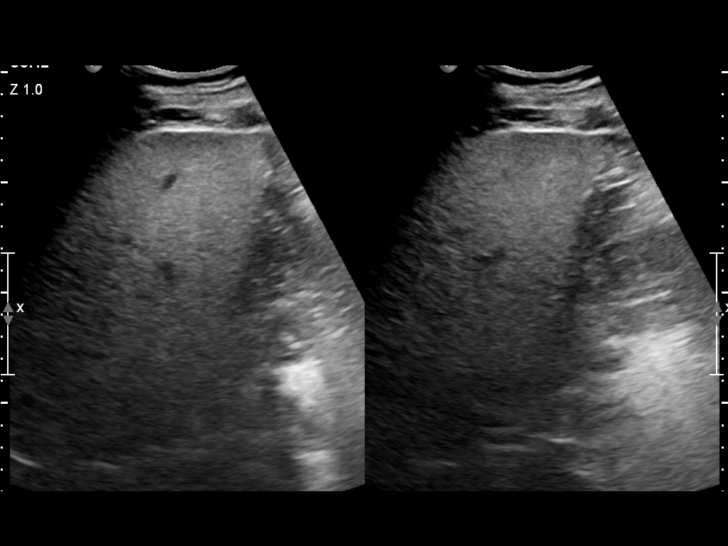
[im 23/60]
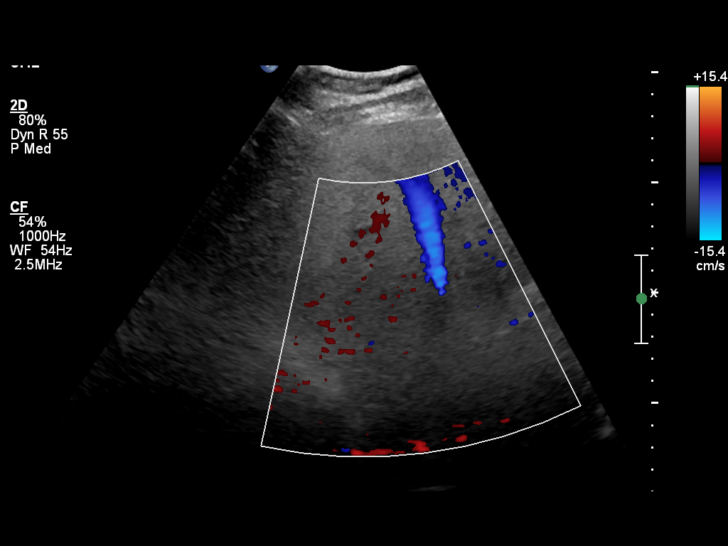
[im 28/60]
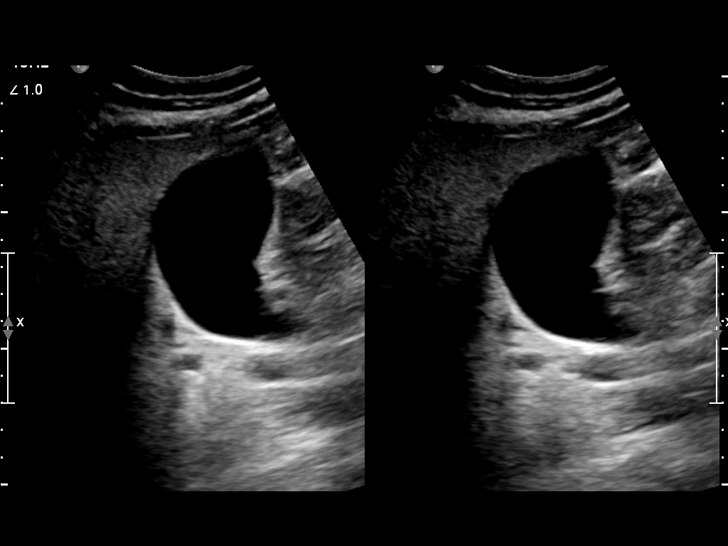
[im 32/60]
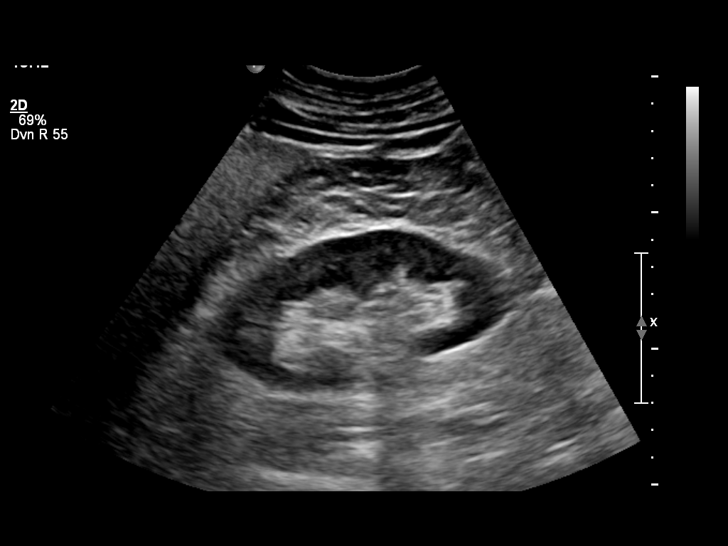
[im 37/60]
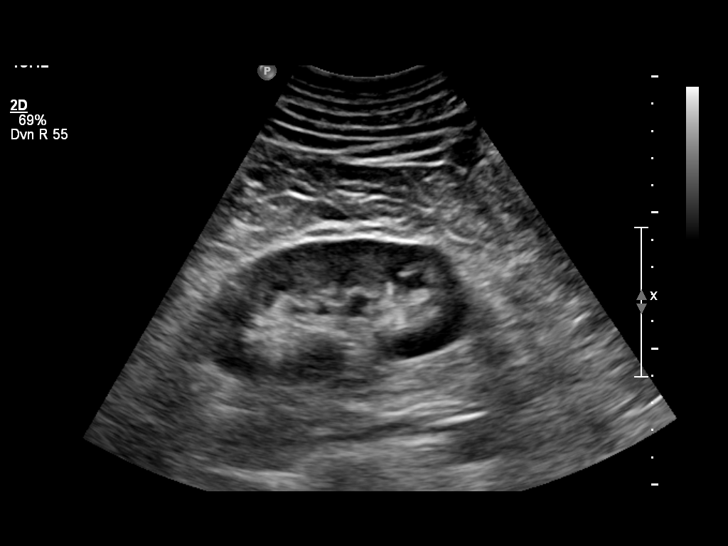
[im 40/60]
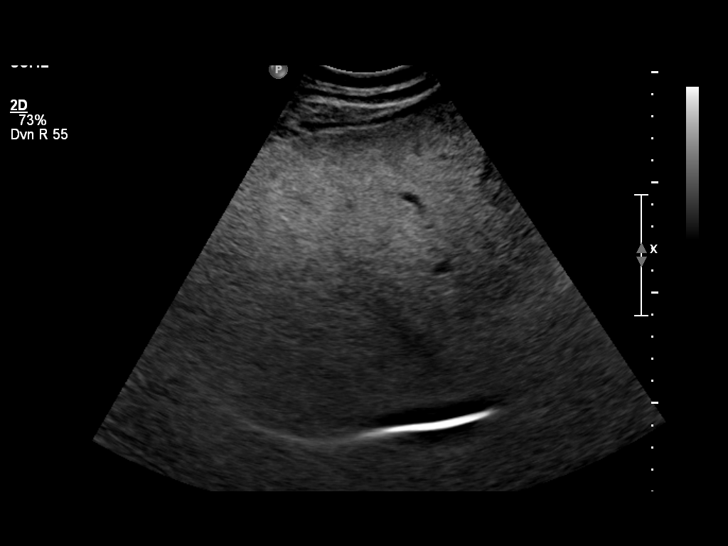
[im 45/60]
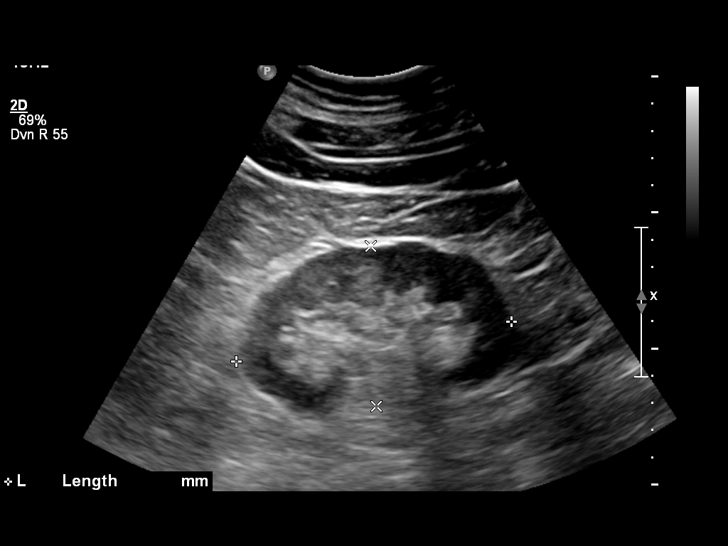
[im 50/60]
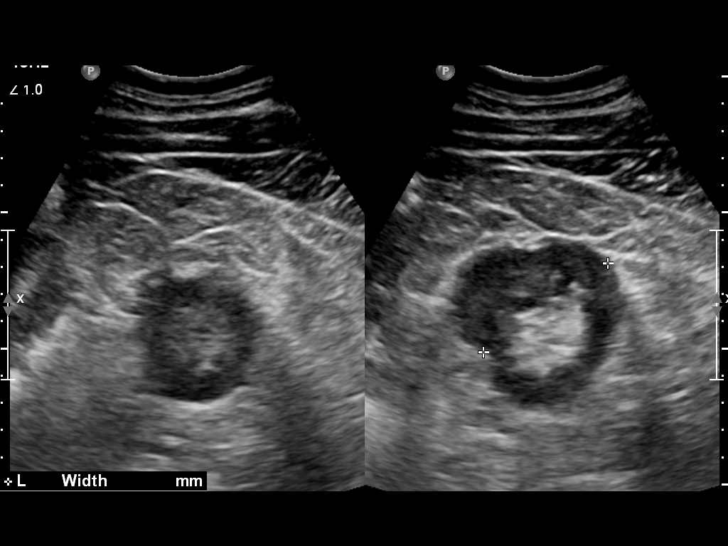
[im 55/60]
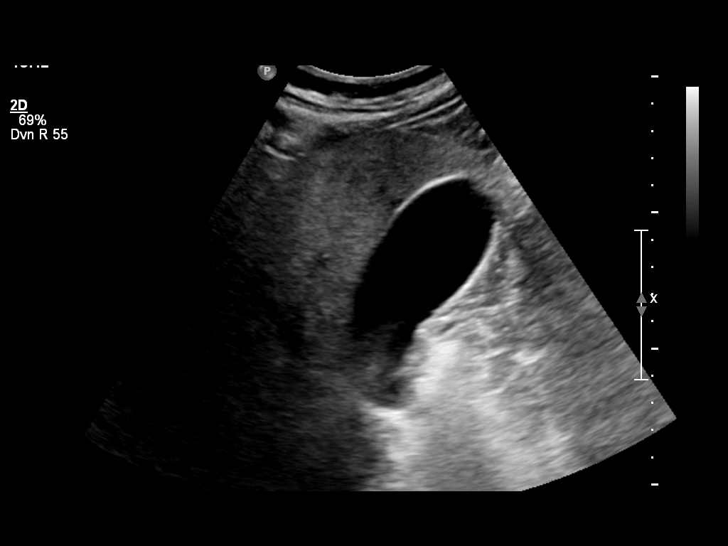
[im 60/60]
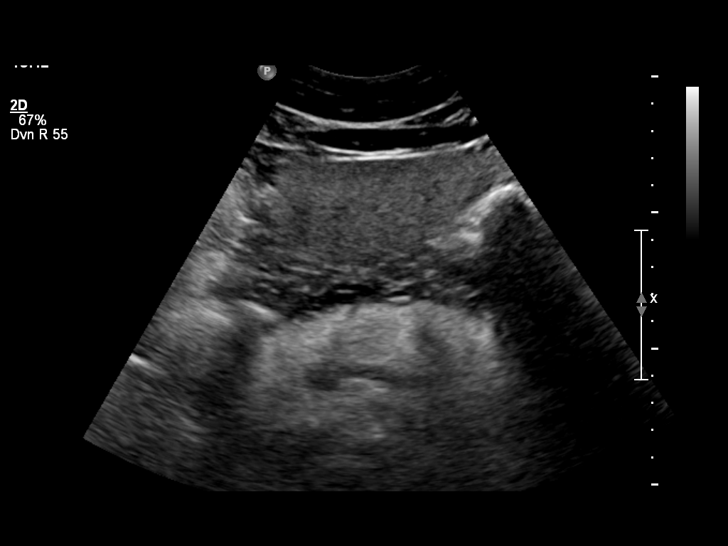

[14 of 25 positions shown; findings below may reference images not displayed]

FINDINGS: The liver demonstrates slightly increased diffuse echogenicity compatible with diffuse fatty liver infiltration.  There is no focal hepatic mass or biliary dilatation.  The common duct measures 6 mm in diameter. 

The gallbladder appears unremarkable with no evidence of cholelithiasis, gallbladder wall thickening, or pericholecystic fluid.  

The visualized pancreas, inferior vena cava, and aorta appear within normal limits.  Both kidneys appear normal in size and appearance with no evidence of hydronephrosis or nephrolithiasis.
IMPRESSION: Diffuse fatty liver infiltration.

## 2021-10-24 ENCOUNTER — Other Ambulatory Visit: Payer: Self-pay

## 2021-10-24 ENCOUNTER — Inpatient Hospital Stay
Admission: RE | Admit: 2021-10-24 | Discharge: 2021-10-24 | Disposition: A | Discharge status: Home / Self Care | Payer: 59 | Source: Ambulatory Visit | Attending: Family | Admitting: Family

## 2021-10-24 DIAGNOSIS — R1011 Right upper quadrant pain: Secondary | ICD-10-CM | POA: Insufficient documentation

## 2021-10-24 MED ORDER — SINCALIDE 5 MCG SOLUTION FOR INJECTION
INTRAMUSCULAR | Status: AC
Start: 2021-10-24 — End: 2021-10-24
  Filled 2021-10-24: qty 5

## 2021-10-24 MED ORDER — SINCALIDE 5 MCG SOLUTION FOR INJECTION
2.1000 ug | INTRAMUSCULAR | Status: DC
Start: 2021-10-24 — End: 2021-10-25

## 2021-10-29 ENCOUNTER — Encounter (INDEPENDENT_AMBULATORY_CARE_PROVIDER_SITE_OTHER): Payer: Self-pay

## 2021-10-29 DIAGNOSIS — K828 Other specified diseases of gallbladder: Secondary | ICD-10-CM

## 2021-10-29 NOTE — Telephone Encounter (Signed)
-----   Message from Calimesa Q. Schlichting sent at 10/29/2021 10:23 AM EDT -----  Regarding: Referral  Contact: [phone]  Yes.  Make Appt for Duremidies please!!

## 2021-11-13 ENCOUNTER — Encounter (INDEPENDENT_AMBULATORY_CARE_PROVIDER_SITE_OTHER): Payer: Self-pay | Admitting: Internal Medicine

## 2021-11-13 DIAGNOSIS — E538 Deficiency of other specified B group vitamins: Secondary | ICD-10-CM | POA: Insufficient documentation

## 2021-11-13 DIAGNOSIS — E559 Vitamin D deficiency, unspecified: Secondary | ICD-10-CM | POA: Insufficient documentation

## 2021-11-14 ENCOUNTER — Encounter (INDEPENDENT_AMBULATORY_CARE_PROVIDER_SITE_OTHER): Payer: Self-pay | Admitting: Internal Medicine

## 2021-11-14 ENCOUNTER — Other Ambulatory Visit: Payer: Self-pay

## 2021-11-14 ENCOUNTER — Ambulatory Visit (INDEPENDENT_AMBULATORY_CARE_PROVIDER_SITE_OTHER): Payer: 59 | Admitting: Internal Medicine

## 2021-11-14 VITALS — BP 124/68 | HR 83 | Ht 68.0 in | Wt 236.0 lb

## 2021-11-14 DIAGNOSIS — M109 Gout, unspecified: Secondary | ICD-10-CM

## 2021-11-14 DIAGNOSIS — E559 Vitamin D deficiency, unspecified: Secondary | ICD-10-CM

## 2021-11-14 DIAGNOSIS — L739 Follicular disorder, unspecified: Secondary | ICD-10-CM

## 2021-11-14 DIAGNOSIS — E538 Deficiency of other specified B group vitamins: Secondary | ICD-10-CM

## 2021-11-14 DIAGNOSIS — E785 Hyperlipidemia, unspecified: Secondary | ICD-10-CM | POA: Insufficient documentation

## 2021-11-14 DIAGNOSIS — Z0000 Encounter for general adult medical examination without abnormal findings: Secondary | ICD-10-CM

## 2021-11-14 DIAGNOSIS — F411 Generalized anxiety disorder: Secondary | ICD-10-CM

## 2021-11-14 DIAGNOSIS — K760 Fatty (change of) liver, not elsewhere classified: Secondary | ICD-10-CM

## 2021-11-14 DIAGNOSIS — M1A09X0 Idiopathic chronic gout, multiple sites, without tophus (tophi): Secondary | ICD-10-CM

## 2021-11-14 DIAGNOSIS — I10 Essential (primary) hypertension: Secondary | ICD-10-CM

## 2021-11-14 MED ORDER — COLCHICINE 0.6 MG TABLET
0.6000 mg | ORAL_TABLET | Freq: Three times a day (TID) | ORAL | 3 refills | Status: DC | PRN
Start: 2021-11-14 — End: 2023-08-18

## 2021-11-14 MED ORDER — ATORVASTATIN 40 MG TABLET
40.0000 mg | ORAL_TABLET | Freq: Every day | ORAL | 3 refills | Status: DC
Start: 2021-11-14 — End: 2022-04-19

## 2021-11-14 MED ORDER — DOXYCYCLINE HYCLATE 100 MG CAPSULE
100.0000 mg | ORAL_CAPSULE | Freq: Two times a day (BID) | ORAL | 4 refills | Status: DC
Start: 2021-11-14 — End: 2022-01-14

## 2021-11-14 MED ORDER — RAMIPRIL 10 MG CAPSULE
10.0000 mg | ORAL_CAPSULE | Freq: Two times a day (BID) | ORAL | 3 refills | Status: DC
Start: 2021-11-14 — End: 2022-05-22

## 2021-11-14 MED ORDER — METOPROLOL SUCCINATE ER 25 MG TABLET,EXTENDED RELEASE 24 HR
25.0000 mg | ORAL_TABLET | Freq: Every day | ORAL | 3 refills | Status: DC
Start: 2021-11-14 — End: 2022-04-22

## 2021-11-14 MED ORDER — ALLOPURINOL 300 MG TABLET
ORAL_TABLET | ORAL | 3 refills | Status: DC
Start: 2021-11-14 — End: 2022-04-22

## 2021-11-14 MED ORDER — SCOPOLAMINE 1 MG OVER 3 DAYS TRANSDERMAL PATCH
1.0000 | MEDICATED_PATCH | TRANSDERMAL | 1 refills | Status: DC
Start: 2021-11-14 — End: 2022-04-02

## 2021-11-14 NOTE — Addendum Note (Signed)
Addended byKatrinka Blazing, Ayiden Milliman on: 11/14/2021 10:32 AM     Modules accepted: Orders

## 2021-11-14 NOTE — Progress Notes (Addendum)
[facility]  [address]  [place] [address]       Name: Greg Booth MRN:  [id number]   Date: 11/14/2021 Age: 51 y.o.       Chief Complaint:    Chief Complaint   Patient presents with    Annual Wellness Exam     Annual wellness exam pt states he has bumps in his head and under his arm and bursitis in his knee        HPI:  Greg Booth is a 51 y.o. male who presents to the office today for follow-up.  Patient is actually doing relatively well but wishes to discuss wellness exam.         Past Medical History:  Past Medical History:   Diagnosis Date    Change in bowel habits     Generalized anxiety disorder     Gout, unspecified     High cholesterol     HTN (hypertension)          History reviewed. No pertinent surgical history.   Current Outpatient Medications   Medication Sig    allopurinoL (ZYLOPRIM) 300 mg Oral Tablet 400 mg daily - one tablet of 300 mg and 1 tablet of 100 mg daily    atorvastatin (LIPITOR) 40 mg Oral Tablet Take 1 Tablet (40 mg total) by mouth Once a day    colchicine, gout, 0.6 mg Oral Tablet Take 1 Tablet (0.6 mg total) by mouth Three times a day as needed (Gout Flares)    doxycycline hyclate (VIBRAMYCIN) 100 mg Oral Capsule Take 1 Capsule (100 mg total) by mouth Twice daily    doxycycline hyclate (VIBRAMYCIN) 100 mg Oral Capsule Take 1 Capsule (100 mg total) by mouth Twice daily    metoprolol succinate (TOPROL-XL) 25 mg Oral Tablet Sustained Release 24 hr Take 1 Tablet (25 mg total) by mouth Once a day    ramipriL (ALTACE) 10 mg Oral Capsule Take 1 Capsule (10 mg total) by mouth Twice daily    scopolamine (TRANSDERM SCOP) 1 mg over 3 days Transdermal patch 3 day Place 1 Patch on the skin Every 72 hours     Allergies   Allergen Reactions    Penicillins Rash    Sulfa (Sulfonamides) Rash       Family History:  Family Medical History:       Problem Relation (Age of Onset)    Diabetes Mother, Father    Gout Mother, Father    Hypertension (High Blood  Pressure) Mother, Father    Renal Tumor Father              Social History:   Social History     Tobacco Use   Smoking Status Never    Passive exposure: Never   Smokeless Tobacco Never     Social History     Substance and Sexual Activity   Alcohol Use Yes    Comment: "Occasional"     Social History     Occupational History    Not on file       Review of Systems:  Review of systems as discussed in HPI    Problem List:  Patient Active Problem List   Diagnosis    Gout, unspecified    High cholesterol    HTN (hypertension)    Generalized anxiety disorder    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   Constitutional:       General: He is not in acute distress.     Appearance: Normal appearance.   HENT:      Head: Normocephalic.      Right Ear: Tympanic membrane normal.      Left Ear: Tympanic membrane normal.      Nose: Nose normal.      Mouth/Throat:      Mouth: Mucous membranes are moist.   Eyes:      General: No scleral icterus.     Extraocular Movements: Extraocular movements intact.      Conjunctiva/sclera: Conjunctivae normal.      Pupils: Pupils are equal, round, and reactive to light.   Neck:      Vascular: No carotid bruit.   Cardiovascular:      Rate and Rhythm: Normal rate and regular rhythm.      Pulses: Normal pulses.           Dorsalis pedis pulses are 2+ on the right side and 2+ on the left side.        Posterior tibial pulses are 2+ on the right side and 2+ on the left side.      Heart sounds: Normal heart sounds.   Pulmonary:      Effort: Pulmonary effort is normal.      Breath sounds:  Normal breath sounds. No wheezing, rhonchi or rales.   Abdominal:      General: Abdomen is flat. Bowel sounds are normal.      Tenderness: There is no abdominal tenderness. There is no guarding.   Musculoskeletal:         General: No swelling. Normal range of motion.      Cervical back: Normal range of motion. No tenderness.      Right lower leg: No edema.      Left lower leg: No edema.   Skin:     General: Skin is warm.      Capillary Refill: Capillary refill takes less than 2 seconds.   Neurological:      General: No focal deficit present.      Mental Status: He is alert and oriented to person, place, and time.      Cranial Nerves: No cranial nerve deficit.            Health Maintenance:  Health Maintenance   Topic Date Due    Hepatitis B Vaccine (1 of 3 - 3-dose series) Never done    HIV Screening  Never done    Adult Tdap-Td (1 - Tdap) Never done    Colonoscopy  Never done    Covid-19 Vaccine (3 - Pfizer series) 06/05/2019    Influenza Vaccine (1) 11/16/2021    Depression Screening  11/15/2022    Meningococcal Vaccine  Aged Out    Pneumococcal Vaccine, Age 74-64  Aged Out        Assessment:      ICD-10-CM    1. Annual physical exam  Z00.00 Exam performed  2. Primary hypertension  I10 Blood Pressure today is stable and will continue current treatment plans     metoprolol succinate (TOPROL-XL) 25 mg Oral Tablet Sustained Release 24 hr     ramipriL (ALTACE) 10 mg Oral Capsule     CBC     COMPREHENSIVE METABOLIC PANEL, NON-FASTING     URINALYSIS WITH REFLEX MICROSCOPIC AND CULTURE IF POSITIVE      3. Nonalcoholic fatty liver disease without nonalcoholic steatohepatitis (NASH)  K76.0 Condition stable will continue current therapy.        4. Vitamin D deficiency  E55.9 MAGNESIUM      5. Vitamin B12 deficiency (non anemic)  E53.8 Labs ordered here and on this visit will be be addressed by me. I will contact patient and address therapy options once completed.       6. Generalized anxiety disorder  F41.1 Condition  stable will continue current therapy.        7. Idiopathic chronic gout of multiple sites without tophus  M1A.09X0 The patient's condition is unchanged from prior.  We have discussed whether there is need for any therapeutic changes and the patient states that they are tolerating current treatments and will just continue with current lifestyle.       8. Gout of multiple sites, unspecified cause, unspecified chronicity  M10.9 allopurinoL (ZYLOPRIM) 300 mg Oral Tablet     colchicine, gout, 0.6 mg Oral Tablet     URIC ACID      9. Hyperlipidemia, unspecified hyperlipidemia type  E78.5 Labs ordered here and on this visit will be be addressed by me. I will contact patient and address therapy options once completed.     atorvastatin (LIPITOR) 40 mg Oral Tablet     LIPID PANEL      10. 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    SEDIMENTATION RATE     C-REACTIVE PROTEIN(CRP),INFLAMMATION     MRSA SCREEN             Orders Placed This Encounter    MRSA SCREEN    CBC    COMPREHENSIVE METABOLIC PANEL, NON-FASTING    URINALYSIS WITH REFLEX MICROSCOPIC AND CULTURE IF POSITIVE    MAGNESIUM    SEDIMENTATION RATE    C-REACTIVE PROTEIN(CRP),INFLAMMATION    URIC ACID    LIPID PANEL    allopurinoL (ZYLOPRIM) 300 mg Oral Tablet    atorvastatin (LIPITOR) 40 mg Oral Tablet    metoprolol succinate (TOPROL-XL) 25 mg Oral Tablet Sustained Release 24 hr    ramipriL (ALTACE) 10 mg Oral Capsule    colchicine, gout, 0.6 mg Oral Tablet    scopolamine (TRANSDERM SCOP) 1 mg over 3 days Transdermal patch 3 day    doxycycline hyclate (VIBRAMYCIN) 100 mg Oral Capsule       Depression screening is negative. PHQ 2 Total: 0          Follow up:  Return in about 2 months (around 01/14/2022).    This note was partially created using voice recognition software and is inherently subject to errors including those of syntax and "sound alike " substitutions which may escape proof reading.  In such instances, original meaning may be extrapolated by contextual  derivation.    Lucia Gaskins, DO  11/14/2021, 10:14

## 2021-11-15 ENCOUNTER — Other Ambulatory Visit: Payer: 59 | Attending: Internal Medicine

## 2021-11-15 ENCOUNTER — Encounter (INDEPENDENT_AMBULATORY_CARE_PROVIDER_SITE_OTHER): Payer: Self-pay | Admitting: Internal Medicine

## 2021-11-15 DIAGNOSIS — I10 Essential (primary) hypertension: Secondary | ICD-10-CM | POA: Insufficient documentation

## 2021-11-15 DIAGNOSIS — L739 Follicular disorder, unspecified: Secondary | ICD-10-CM | POA: Insufficient documentation

## 2021-11-15 DIAGNOSIS — E785 Hyperlipidemia, unspecified: Secondary | ICD-10-CM | POA: Insufficient documentation

## 2021-11-15 DIAGNOSIS — M109 Gout, unspecified: Secondary | ICD-10-CM | POA: Insufficient documentation

## 2021-11-15 DIAGNOSIS — E559 Vitamin D deficiency, unspecified: Secondary | ICD-10-CM | POA: Insufficient documentation

## 2021-11-15 LAB — URINALYSIS, MACRO/MICRO
BILIRUBIN: NEGATIVE mg/dL
BLOOD: 0.06 mg/dL — AB
GLUCOSE: NEGATIVE mg/dL
KETONES: NEGATIVE mg/dL
LEUKOCYTES: NEGATIVE WBCs/uL
NITRITE: NEGATIVE
PH: 5 (ref 5.0–9.0)
PROTEIN: NEGATIVE mg/dL
SPECIFIC GRAVITY: 1.016 (ref 1.002–1.030)
UROBILINOGEN: NORMAL mg/dL

## 2021-11-15 LAB — COMPREHENSIVE METABOLIC PANEL, NON-FASTING
ALBUMIN/GLOBULIN RATIO: 1.9 — ABNORMAL HIGH (ref 0.8–1.4)
ALBUMIN: 4.3 g/dL (ref 3.5–5.7)
ALKALINE PHOSPHATASE: 73 U/L (ref 34–104)
ALT (SGPT): 46 U/L (ref 7–52)
ANION GAP: 7 mmol/L (ref 4–13)
AST (SGOT): 28 U/L (ref 13–39)
BILIRUBIN TOTAL: 1.6 mg/dL — ABNORMAL HIGH (ref 0.3–1.2)
BUN/CREA RATIO: 16 (ref 6–22)
BUN: 15 mg/dL (ref 7–25)
CALCIUM, CORRECTED: 9.3 mg/dL (ref 8.9–10.8)
CALCIUM: 9.6 mg/dL (ref 8.6–10.3)
CHLORIDE: 104 mmol/L (ref 98–107)
CO2 TOTAL: 28 mmol/L (ref 21–31)
CREATININE: 0.96 mg/dL (ref 0.60–1.30)
ESTIMATED GFR: 97 mL/min/{1.73_m2} (ref >59)
GLOBULIN: 2.3 — ABNORMAL LOW (ref 2.9–5.4)
GLUCOSE: 104 mg/dL (ref 74–109)
OSMOLALITY, CALCULATED: 279 mOsm/kg (ref 270–290)
POTASSIUM: 4.1 mmol/L (ref 3.5–5.1)
PROTEIN TOTAL: 6.6 g/dL (ref 6.4–8.9)
SODIUM: 139 mmol/L (ref 136–145)

## 2021-11-15 LAB — CBC
HCT: 42.1 % (ref 42.0–51.0)
HGB: 14.2 g/dL (ref 13.5–18.0)
MCH: 30.1 pg (ref 27.0–32.0)
MCHC: 33.7 g/dL (ref 32.0–36.0)
MCV: 89.4 fL (ref 78.0–99.0)
MPV: 7.8 fL (ref 7.4–10.4)
PLATELETS: 272 10*3/uL (ref 140–440)
RBC: 4.71 10*6/uL (ref 4.20–6.00)
RDW: 13.1 % (ref 11.6–14.8)
WBC: 8.5 10*3/uL (ref 4.0–10.5)
WBCS UNCORRECTED: 8.5 10*3/uL

## 2021-11-15 LAB — LIPID PANEL
CHOL/HDL RATIO: 4.2
CHOLESTEROL: 142 mg/dL (ref <200)
HDL CHOL: 34 mg/dL (ref 23–92)
LDL CALC: 54 mg/dL (ref 0–100)
TRIGLYCERIDES: 270 mg/dL — ABNORMAL HIGH (ref <=150)
VLDL CALC: 54 mg/dL — ABNORMAL HIGH (ref 0–50)

## 2021-11-15 LAB — URIC ACID: URIC ACID: 6.4 mg/dL (ref 2.3–7.6)

## 2021-11-15 LAB — SEDIMENTATION RATE: ERYTHROCYTE SEDIMENTATION RATE (ESR): 5 mm/hr (ref <15)

## 2021-11-15 LAB — C-REACTIVE PROTEIN(CRP),INFLAMMATION: C-REACTIVE PROTEIN (CRP): 0.7 mg/dL — ABNORMAL HIGH (ref 0.1–0.5)

## 2021-11-15 LAB — MAGNESIUM: MAGNESIUM: 1.7 mg/dL — ABNORMAL LOW (ref 1.9–2.7)

## 2021-11-16 ENCOUNTER — Telehealth (INDEPENDENT_AMBULATORY_CARE_PROVIDER_SITE_OTHER): Payer: Self-pay | Admitting: Internal Medicine

## 2021-11-16 LAB — MRSA SCREEN

## 2021-11-16 MED ORDER — MUPIROCIN 2 % TOPICAL OINTMENT
TOPICAL_OINTMENT | Freq: Three times a day (TID) | CUTANEOUS | 0 refills | Status: AC
Start: 2021-11-16 — End: 2021-11-26

## 2021-11-16 NOTE — Telephone Encounter (Signed)
Sent to pharmacy.  Unable to reach patient by phone, my chart message sent.

## 2021-11-16 NOTE — Telephone Encounter (Signed)
-----   Message from Lucia Gaskins, DO sent at 11/16/2021 12:22 PM EDT -----  Bactroban ointment apply to each nares TID for 10 days

## 2021-11-20 ENCOUNTER — Encounter (INDEPENDENT_AMBULATORY_CARE_PROVIDER_SITE_OTHER): Payer: Self-pay | Admitting: Internal Medicine

## 2021-11-28 ENCOUNTER — Encounter (HOSPITAL_COMMUNITY): Admission: RE | Payer: Self-pay | Source: Ambulatory Visit

## 2021-11-28 ENCOUNTER — Inpatient Hospital Stay (HOSPITAL_COMMUNITY): Admission: RE | Admit: 2021-11-28 | Payer: 59 | Source: Ambulatory Visit | Admitting: Surgery

## 2021-11-28 SURGERY — COLONOSCOPY
Anesthesia: General

## 2021-12-11 ENCOUNTER — Ambulatory Visit (INDEPENDENT_AMBULATORY_CARE_PROVIDER_SITE_OTHER): Payer: 59 | Admitting: Surgery

## 2021-12-11 ENCOUNTER — Other Ambulatory Visit: Payer: Self-pay

## 2021-12-11 ENCOUNTER — Encounter (INDEPENDENT_AMBULATORY_CARE_PROVIDER_SITE_OTHER): Payer: Self-pay | Admitting: Surgery

## 2021-12-11 VITALS — BP 117/68 | HR 84 | Temp 98.1°F | Resp 18 | Ht 68.0 in | Wt 227.0 lb

## 2021-12-11 DIAGNOSIS — Z1211 Encounter for screening for malignant neoplasm of colon: Secondary | ICD-10-CM

## 2021-12-11 DIAGNOSIS — K828 Other specified diseases of gallbladder: Secondary | ICD-10-CM

## 2021-12-11 MED ORDER — SUTAB 1.479-0.188-0.225 GRAM TABLET
ORAL_TABLET | ORAL | 0 refills | Status: DC
Start: 2021-12-11 — End: 2022-04-02

## 2021-12-11 NOTE — H&P (Signed)
GENERAL SURGERY, [facility]  [address]  [place] [address]       Name: Greg Booth MRN:  [id number]   Date: 12/11/2021 Age: 51 y.o. 1970/10/09      PCP: Lucia Gaskins, DO     Subjective  Greg Booth is a 51 y.o. year old male who presents for screening colonoscopy.  He was initially scheduled for biliary dyskinesia, however HIDA showed 33% EF and his symptoms have essentially resolved.  He would like to hold on pursuing any type of action on the gallbladder for now. Reviewed results of HIDA in detail with patient.  He would like to reschedule his colonoscopy.   No current GI complaints.  No constipation.  No abdominal pain.  Mild rectal bleeding from hemorrhoids.  No unexplained weight loss.      Family history of colon cancer:  none    Last colonoscopy:  none    Patient's referral to this office included a recent assessment by the referring provider.  This was reviewed by me for this unique office visit for the indication and intent of the referral as well as any pertinent medical or surgical history relevant to the patient's independent evaluation by me today.     Allergies   Allergen Reactions    Penicillins Rash    Sulfa (Sulfonamides) Rash      Current Outpatient Medications   Medication Sig    allopurinoL (ZYLOPRIM) 300 mg Oral Tablet 400 mg daily - one tablet of 300 mg and 1 tablet of 100 mg daily    atorvastatin (LIPITOR) 40 mg Oral Tablet Take 1 Tablet (40 mg total) by mouth Once a day    colchicine, gout, 0.6 mg Oral Tablet Take 1 Tablet (0.6 mg total) by mouth Three times a day as needed (Gout Flares)    doxycycline hyclate (VIBRAMYCIN) 100 mg Oral Capsule Take 1 Capsule (100 mg total) by mouth Twice daily    doxycycline hyclate (VIBRAMYCIN) 100 mg Oral Capsule Take 1 Capsule (100 mg total) by mouth Twice daily    metoprolol succinate (TOPROL-XL) 25 mg Oral Tablet Sustained Release 24 hr Take 1 Tablet (25 mg total) by mouth Once a day    ramipriL  (ALTACE) 10 mg Oral Capsule Take 1 Capsule (10 mg total) by mouth Twice daily    scopolamine (TRANSDERM SCOP) 1 mg over 3 days Transdermal patch 3 day Place 1 Patch on the skin Every 72 hours    sod sulf-pot chloride-mag sulf (SUTAB) 1.479-0.188- 0.225 gram Oral Tablet Take 12 pills at 10:00am to 10:30am Take 12 pills at 6:00pm to 6:30pm. Drink plenty of water          Objective:       Vitals:    12/11/21 1306   BP: 117/68   Pulse: 84   Resp: 18   Temp: 36.7 C (98.1 F)   SpO2: 98%   Weight: 103 kg (227 lb)   Height: 1.727 m (5\' 8" )   BMI: 34.59        General: appropriate for age. in no acute distress.    Vital signs are present above and have been reviewed by me     HEENT: Atraumatic, Normocephalic.    Lungs: Nonlabored breathing with symmetric expansion    Heart:Regular wth respect to rate and rythmn.    Abdomen:Soft. Nontender. Nondistended     Psychiatric: Alert and oriented to person, place, and time. affect appropriate  Assessment/Plan    ICD-10-CM    1. Screening for malignant neoplasm of colon  Z12.11       2. Biliary dyskinesia  K82.8            Discussed indications, risks, and benefits of colonoscopy with the patient.  Discussed the possibility of polypectomy, biopsies, and repeat possible examinations.  Risks discussed include bleeding, sedation risks, possibility of missed diagnosis of polyp malignancy, and remote possibility of perforation and/or death.  All questions answered and informed consent clearly obtained.      Office Visit was used for detailed explanation procedure and its indications, review of the patient's medications relative to the time before and after the procedure, and the effects of the associated medical conditions that affect the procedure preparation and procedure itself.    Karlis Cregg Will, FNP-C

## 2022-01-09 ENCOUNTER — Telehealth: Payer: 59 | Admitting: NURSE PRACTITIONER

## 2022-01-09 ENCOUNTER — Other Ambulatory Visit: Payer: Self-pay

## 2022-01-09 ENCOUNTER — Encounter (INDEPENDENT_AMBULATORY_CARE_PROVIDER_SITE_OTHER): Payer: Self-pay | Admitting: NURSE PRACTITIONER

## 2022-01-09 DIAGNOSIS — R059 Cough, unspecified: Secondary | ICD-10-CM

## 2022-01-09 DIAGNOSIS — J069 Acute upper respiratory infection, unspecified: Secondary | ICD-10-CM

## 2022-01-09 MED ORDER — PROMETHAZINE-DM 6.25 MG-15 MG/5 ML ORAL SYRUP
5.0000 mL | ORAL_SOLUTION | Freq: Four times a day (QID) | ORAL | 0 refills | Status: DC | PRN
Start: 2022-01-09 — End: 2022-04-16

## 2022-01-09 MED ORDER — METHYLPREDNISOLONE 4 MG TABLETS IN A DOSE PACK
ORAL_TABLET | ORAL | 0 refills | Status: DC
Start: 2022-01-09 — End: 2022-01-14

## 2022-01-09 NOTE — Progress Notes (Signed)
[facility]  [address]  [place] [address]    Telephone Visit    Name:  PHILEMON RIEDESEL MRN: [id number]   Date:  01/09/2022 Age:   51 y.o.     The patient/family initiated a request for telephone service.  Verbal consent for this service was obtained from the patient/family.    Last office visit in this department: 11/14/2021      Reason for call: Congestion/Nasal drainage  Call notes:    Complaining of cough, coughing up yellow sputum, increased nasal drainage, chest congestion, and sore throat. States symptoms have worsened over the past several days. Has taken a couple of covid test which are negative.         ICD-10-CM    1. Upper respiratory tract infection, unspecified type  J06.9 Methylprednisolone (MEDROL DOSEPACK) 4 mg Oral Tablets, Dose Pack      2. Cough, unspecified type  R05.9 promethazine-dextromethorphan (PHENERGAN-DM) 6.25-15 mg/5 mL Oral Syrup          Total provider time spent with the patient on the phone: 5 minutes.    Eleanora Neighbor, FNP-C

## 2022-01-14 ENCOUNTER — Ambulatory Visit (INDEPENDENT_AMBULATORY_CARE_PROVIDER_SITE_OTHER): Payer: 59 | Admitting: Internal Medicine

## 2022-01-14 ENCOUNTER — Other Ambulatory Visit: Payer: Self-pay

## 2022-01-14 ENCOUNTER — Encounter (INDEPENDENT_AMBULATORY_CARE_PROVIDER_SITE_OTHER): Payer: Self-pay | Admitting: Internal Medicine

## 2022-01-14 VITALS — BP 132/73 | HR 72 | Resp 18 | Ht 68.0 in | Wt 227.0 lb

## 2022-01-14 DIAGNOSIS — E7800 Pure hypercholesterolemia, unspecified: Secondary | ICD-10-CM

## 2022-01-14 DIAGNOSIS — E538 Deficiency of other specified B group vitamins: Secondary | ICD-10-CM

## 2022-01-14 DIAGNOSIS — K760 Fatty (change of) liver, not elsewhere classified: Secondary | ICD-10-CM

## 2022-01-14 DIAGNOSIS — R5382 Chronic fatigue, unspecified: Secondary | ICD-10-CM

## 2022-01-14 DIAGNOSIS — Z22322 Carrier or suspected carrier of Methicillin resistant Staphylococcus aureus: Secondary | ICD-10-CM

## 2022-01-14 DIAGNOSIS — I10 Essential (primary) hypertension: Secondary | ICD-10-CM

## 2022-01-14 DIAGNOSIS — M1A09X0 Idiopathic chronic gout, multiple sites, without tophus (tophi): Secondary | ICD-10-CM

## 2022-01-14 DIAGNOSIS — E782 Mixed hyperlipidemia: Secondary | ICD-10-CM

## 2022-01-14 DIAGNOSIS — R739 Hyperglycemia, unspecified: Secondary | ICD-10-CM

## 2022-01-14 MED ORDER — CLINDAMYCIN PHOSPHATE 1 % TOPICAL SOLUTION
Freq: Two times a day (BID) | CUTANEOUS | 2 refills | Status: AC
Start: 2022-01-14 — End: ?

## 2022-01-14 MED ORDER — FENOFIBRATE NANOCRYSTALLIZED 145 MG TABLET
145.0000 mg | ORAL_TABLET | Freq: Every morning | ORAL | 4 refills | Status: DC
Start: 2022-01-14 — End: 2023-05-23

## 2022-01-14 NOTE — Addendum Note (Signed)
Addended byTamala Julian, Kaison Mcparland on: 01/14/2022 04:56 PM     Modules accepted: Orders

## 2022-01-14 NOTE — Progress Notes (Signed)
[facility]  [address]  [place] [address]       Name: Greg Booth MRN:  [id number]   Date: 01/14/2022 Age: 51 y.o.       Chief Complaint:    Chief Complaint   Patient presents with    Follow Up     2 month check up        HPI:  Greg Booth is a 51 y.o. male who presents to the office today for follow-up.  Patient is actually doing relatively well and except for minor issues as described below which I have addressed in total with the patient.        Past Medical History:  Past Medical History:   Diagnosis Date    Change in bowel habits     Generalized anxiety disorder     Gout, unspecified     High cholesterol     HTN (hypertension)          History reviewed. No pertinent surgical history.   Current Outpatient Medications   Medication Sig    allopurinoL (ZYLOPRIM) 300 mg Oral Tablet 400 mg daily - one tablet of 300 mg and 1 tablet of 100 mg daily (Patient taking differently: Take 1 Tablet (300 mg total) by mouth Once a day 400 mg daily - one tablet of 300 mg and 1 tablet of 100 mg daily)    atorvastatin (LIPITOR) 40 mg Oral Tablet Take 1 Tablet (40 mg total) by mouth Once a day    colchicine, gout, 0.6 mg Oral Tablet Take 1 Tablet (0.6 mg total) by mouth Three times a day as needed (Gout Flares)    doxycycline hyclate (VIBRAMYCIN) 100 mg Oral Capsule Take 1 Capsule (100 mg total) by mouth Twice daily    fenofibrate nanocrystallized (TRICOR) 145 mg Oral Tablet Take 1 Tablet (145 mg total) by mouth Every morning with breakfast    metoprolol succinate (TOPROL-XL) 25 mg Oral Tablet Sustained Release 24 hr Take 1 Tablet (25 mg total) by mouth Once a day    promethazine-dextromethorphan (PHENERGAN-DM) 6.25-15 mg/5 mL Oral Syrup Take 5 mL by mouth Four times a day as needed for Cough    ramipriL (ALTACE) 10 mg Oral Capsule Take 1 Capsule (10 mg total) by mouth Twice daily    scopolamine (TRANSDERM SCOP) 1 mg over 3 days Transdermal patch 3 day Place 1 Patch on the skin  Every 72 hours    sod sulf-pot chloride-mag sulf (SUTAB) 1.479-0.188- 0.225 gram Oral Tablet Take 12 pills at 10:00am to 10:30am Take 12 pills at 6:00pm to 6:30pm. Drink plenty of water     Allergies   Allergen Reactions    Penicillins Rash    Sulfa (Sulfonamides) Rash       Family History:  Family Medical History:       Problem Relation (Age of Onset)    Diabetes Mother, Father    Gout Mother, Father    Hypertension (High Blood Pressure) Mother, Father    Renal Tumor Father              Social History:   Social History     Tobacco Use   Smoking Status Never    Passive exposure: Never   Smokeless Tobacco Never     Social History     Substance and Sexual Activity   Alcohol Use Yes    Comment: "Occasional"     Social History     Occupational History  Not on file       Review of Systems:  Review of systems as discussed in HPI    Problem List:  Patient Active Problem List   Diagnosis    Gout, unspecified    High cholesterol    HTN (hypertension)    Generalized anxiety disorder    Nonalcoholic fatty liver disease without nonalcoholic steatohepatitis (NASH)    Idiopathic chronic gout of multiple sites without tophus    Vitamin D deficiency    Vitamin B12 deficiency (non anemic)    Hyperlipidemia    Annual physical exam    MRSA (methicillin resistant Staphylococcus aureus) carrier       Physical Examination:  BP 132/73 (Site: Left, Patient Position: Sitting, Cuff Size: Adult)   Pulse 72   Resp 18   Ht 1.727 m (5\' 8" )   Wt 103 kg (227 lb)   SpO2 96%   BMI 34.52 kg/m       Physical Exam  Vitals and nursing note reviewed.   Constitutional:       General: He is not in acute distress.     Appearance: Normal appearance.   HENT:      Head: Normocephalic.      Right Ear: Tympanic membrane normal.      Left Ear: Tympanic membrane normal.      Nose: Nose normal.      Mouth/Throat:      Mouth: Mucous membranes are moist.   Eyes:      General: No scleral icterus.     Extraocular Movements: Extraocular movements intact.       Conjunctiva/sclera: Conjunctivae normal.      Pupils: Pupils are equal, round, and reactive to light.   Neck:      Vascular: No carotid bruit.   Cardiovascular:      Rate and Rhythm: Normal rate and regular rhythm.      Pulses: Normal pulses.           Dorsalis pedis pulses are 2+ on the right side and 2+ on the left side.        Posterior tibial pulses are 2+ on the right side and 2+ on the left side.      Heart sounds: Normal heart sounds.   Pulmonary:      Effort: Pulmonary effort is normal.      Breath sounds: Normal breath sounds. No wheezing, rhonchi or rales.   Abdominal:      General: Abdomen is flat. Bowel sounds are normal.      Tenderness: There is no abdominal tenderness. There is no guarding.   Musculoskeletal:         General: No swelling. Normal range of motion.      Cervical back: Normal range of motion. No tenderness.      Right lower leg: No edema.      Left lower leg: No edema.   Skin:     General: Skin is warm.      Capillary Refill: Capillary refill takes less than 2 seconds.   Neurological:      General: No focal deficit present.      Mental Status: He is alert and oriented to person, place, and time.      Cranial Nerves: No cranial nerve deficit.              Health Maintenance:  Health Maintenance   Topic Date Due    Adult Tdap-Td (1 - Tdap) Never done    Influenza  Vaccine (1) 02/13/2022 (Originally 11/16/2021)    Colonoscopy  07/16/2022 (Originally 11/11/2017)    Depression Screening  11/15/2022    Meningococcal Vaccine  Aged Out    Pneumococcal Vaccine, Age 48-64  Aged Out    Hepatitis B Vaccine  Discontinued    HIV Screening  Discontinued    Covid-19 Vaccine  Discontinued        Assessment:      ICD-10-CM    1. MRSA (methicillin resistant Staphylococcus aureus) carrier  Z22.322 Condition stable will continue current therapy.        2. Primary hypertension  I10 Blood Pressure today is stable and will continue current treatment plans     CBC     COMPREHENSIVE METABOLIC PANEL, NON-FASTING      TESTOSTERONE, TOTAL, MS      3. High cholesterol  E78.00 The patient wishes to continue with diet and lifestyle modifications before any additional medication is to be offered.  They understand the need for compliance.     fenofibrate nanocrystallized (TRICOR) 145 mg Oral Tablet     LIPID PANEL      4. Mixed hyperlipidemia  E78.2 The patient wishes to continue with diet and lifestyle modifications before any additional medication is to be offered.  They understand the need for compliance.     fenofibrate nanocrystallized (TRICOR) 145 mg Oral Tablet     LIPID PANEL      5. Nonalcoholic fatty liver disease without nonalcoholic steatohepatitis (NASH)  K76.0 Labs ordered here and on this visit will be be addressed by me. I will contact patient and address therapy options once completed.     HGA1C (HEMOGLOBIN A1C WITH EST AVG GLUCOSE)     COMPREHENSIVE METABOLIC PANEL, NON-FASTING      6. Idiopathic chronic gout of multiple sites without tophus  M1A.09X0 Condition stable will continue current therapy.        7. Vitamin B12 deficiency  E53.8 VITAMIN B12      8. Elevated blood sugar  R73.9 PSA, DIAGNOSTIC      9. Chronic fatigue  R53.82 THYROID STIMULATING HORMONE WITH FREE T4 REFLEX     TESTOSTERONE, TOTAL, MS             Orders Placed This Encounter    CBC    LIPID PANEL    PSA, DIAGNOSTIC    VITAMIN B12    HGA1C (HEMOGLOBIN A1C WITH EST AVG GLUCOSE)    COMPREHENSIVE METABOLIC PANEL, NON-FASTING    THYROID STIMULATING HORMONE WITH FREE T4 REFLEX    TESTOSTERONE, TOTAL, MS    fenofibrate nanocrystallized (TRICOR) 145 mg Oral Tablet         Follow up:  Return in about 4 months (around 05/16/2022).    This note was partially created using voice recognition software and is inherently subject to errors including those of syntax and "sound alike " substitutions which may escape proof reading.  In such instances, original meaning may be extrapolated by contextual derivation.    Lucia Gaskins, DO  01/14/2022, 08:18

## 2022-03-20 ENCOUNTER — Inpatient Hospital Stay (HOSPITAL_COMMUNITY): Admit: 2022-03-20 | Payer: 59 | Admitting: Surgery

## 2022-03-20 ENCOUNTER — Encounter (HOSPITAL_COMMUNITY): Payer: Self-pay

## 2022-03-20 SURGERY — COLONOSCOPY
Anesthesia: General

## 2022-04-02 ENCOUNTER — Telehealth: Payer: 59 | Admitting: NURSE PRACTITIONER

## 2022-04-02 ENCOUNTER — Encounter (INDEPENDENT_AMBULATORY_CARE_PROVIDER_SITE_OTHER): Payer: Self-pay | Admitting: NURSE PRACTITIONER

## 2022-04-02 ENCOUNTER — Other Ambulatory Visit: Payer: Self-pay

## 2022-04-02 DIAGNOSIS — B349 Viral infection, unspecified: Secondary | ICD-10-CM

## 2022-04-02 DIAGNOSIS — R509 Fever, unspecified: Secondary | ICD-10-CM

## 2022-04-02 MED ORDER — PREDNISONE 20 MG TABLET
20.0000 mg | ORAL_TABLET | Freq: Every day | ORAL | 0 refills | Status: DC
Start: 2022-04-02 — End: 2022-04-16

## 2022-04-02 NOTE — Progress Notes (Signed)
[facility]  [address]  [place] [address]    Telephone Visit    Name:  Greg Booth MRN: [id number]   Date:  04/02/2022 Age:   52 y.o.     The patient/family initiated a request for telephone service.  Verbal consent for this service was obtained from the patient/family.    Last office visit in this department: 01/14/2022      Reason for call: Fever, body aches  Call notes:    Patient complaining of fever, body aches, sore throat, congestion. Symptoms started yesterday evening. He does work in nursing home. States that he took Covid test this morning that was negative. Informs that he has been taking tylenol and motrin OTC which has helped. He states that his fever has seemed to be better today.   Discussed/educated patient that this is likely a viral illness. Instructed to increase oral fluids. Can take OTC decongestant/antihistamines per product instructions. Can continue to take tylenol/motrin as needed per product instructions for fever/pain/discomfort.    Can return to work when fever free for 24 hours without the use of medication if symptoms are improving.   If symptoms worsen or do not improve, come in for further evaluation.       ICD-10-CM    1. Viral illness  B34.9       2. Fever, unspecified fever cause  R50.9           Total provider time spent with the patient on the phone: 5 minutes.    Eleanora Neighbor, FNP-C

## 2022-04-04 ENCOUNTER — Encounter (INDEPENDENT_AMBULATORY_CARE_PROVIDER_SITE_OTHER): Payer: Self-pay | Admitting: Surgery

## 2022-04-16 ENCOUNTER — Ambulatory Visit (INDEPENDENT_AMBULATORY_CARE_PROVIDER_SITE_OTHER): Payer: 59 | Admitting: Internal Medicine

## 2022-04-16 ENCOUNTER — Encounter (INDEPENDENT_AMBULATORY_CARE_PROVIDER_SITE_OTHER): Payer: Self-pay | Admitting: Internal Medicine

## 2022-04-16 ENCOUNTER — Other Ambulatory Visit: Payer: Self-pay

## 2022-04-16 VITALS — BP 123/66 | HR 66 | Ht 68.0 in | Wt 224.8 lb

## 2022-04-16 DIAGNOSIS — R739 Hyperglycemia, unspecified: Secondary | ICD-10-CM | POA: Insufficient documentation

## 2022-04-16 DIAGNOSIS — E559 Vitamin D deficiency, unspecified: Secondary | ICD-10-CM

## 2022-04-16 DIAGNOSIS — E782 Mixed hyperlipidemia: Secondary | ICD-10-CM

## 2022-04-16 DIAGNOSIS — K760 Fatty (change of) liver, not elsewhere classified: Secondary | ICD-10-CM

## 2022-04-16 DIAGNOSIS — M79641 Pain in right hand: Secondary | ICD-10-CM | POA: Insufficient documentation

## 2022-04-16 DIAGNOSIS — E538 Deficiency of other specified B group vitamins: Secondary | ICD-10-CM

## 2022-04-16 DIAGNOSIS — R5382 Chronic fatigue, unspecified: Secondary | ICD-10-CM | POA: Insufficient documentation

## 2022-04-16 DIAGNOSIS — M109 Gout, unspecified: Secondary | ICD-10-CM

## 2022-04-16 DIAGNOSIS — I10 Essential (primary) hypertension: Secondary | ICD-10-CM

## 2022-04-16 DIAGNOSIS — R351 Nocturia: Secondary | ICD-10-CM

## 2022-04-16 DIAGNOSIS — N401 Enlarged prostate with lower urinary tract symptoms: Secondary | ICD-10-CM | POA: Insufficient documentation

## 2022-04-16 NOTE — Progress Notes (Signed)
[facility]  [address]  [place] [address]       Name: Greg Booth MRN:  [id number]   Date: 04/16/2022 Age: 52 y.o.       Chief Complaint:    Chief Complaint   Patient presents with    Wrist Pain     Pt has been having right wrist pain for awhile. Michela Pitcher he has done several rounds of Prednisone and while on that is does great. No known injures.         HPI:  Greg Booth is a 52 y.o. male who presents to the office today for follow-up.  Patient is actually doing relatively well and except for minor issues as described below which I have addressed in total with the patient.        Past Medical History:  Past Medical History:   Diagnosis Date    Change in bowel habits     Generalized anxiety disorder     Gout, unspecified     High cholesterol     HTN (hypertension)          History reviewed. No pertinent surgical history.   Current Outpatient Medications   Medication Sig    allopurinoL (ZYLOPRIM) 300 mg Oral Tablet 400 mg daily - one tablet of 300 mg and 1 tablet of 100 mg daily (Patient taking differently: Take 1 Tablet (300 mg total) by mouth Once a day 400 mg daily - one tablet of 300 mg and 1 tablet of 100 mg daily)    atorvastatin (LIPITOR) 40 mg Oral Tablet Take 1 Tablet (40 mg total) by mouth Once a day    clindamycin phosphate (CLEOCIN T) 1 % Solution Apply topically Twice daily Apply to area twice daily topically as needed to the back of the scalp    colchicine, gout, 0.6 mg Oral Tablet Take 1 Tablet (0.6 mg total) by mouth Three times a day as needed (Gout Flares)    fenofibrate nanocrystallized (TRICOR) 145 mg Oral Tablet Take 1 Tablet (145 mg total) by mouth Every morning with breakfast    metoprolol succinate (TOPROL-XL) 25 mg Oral Tablet Sustained Release 24 hr Take 1 Tablet (25 mg total) by mouth Once a day    ramipriL (ALTACE) 10 mg Oral Capsule Take 1 Capsule (10 mg total) by mouth Twice daily     Allergies   Allergen Reactions    Penicillins Rash     Sulfa (Sulfonamides) Rash       Family History:  Family Medical History:       Problem Relation (Age of Onset)    Diabetes Mother, Father    Gout Mother, Father    Hypertension (High Blood Pressure) Mother, Father    Renal Tumor Father              Social History:   Social History     Tobacco Use   Smoking Status Never    Passive exposure: Never   Smokeless Tobacco Never     Social History     Substance and Sexual Activity   Alcohol Use Yes    Comment: "Occasional"     Social History     Occupational History    Not on file       Review of Systems:  Review of systems as discussed in HPI    Problem List:  Patient Active Problem List   Diagnosis    Gout, unspecified    HTN (hypertension)  Generalized anxiety disorder    Nonalcoholic fatty liver disease without nonalcoholic steatohepatitis (NASH)    Idiopathic chronic gout of multiple sites without tophus    Vitamin D deficiency    Vitamin B12 deficiency (non anemic)    Hyperlipidemia    Annual physical exam    MRSA (methicillin resistant Staphylococcus aureus) carrier    Benign prostatic hyperplasia with nocturia    Elevated blood sugar    Chronic fatigue    Hand pain, right       Physical Examination:  BP 123/66   Pulse 66   Ht 1.727 m (5\' 8" )   Wt 102 kg (224 lb 12.8 oz)   SpO2 97%   BMI 34.18 kg/m       Physical Exam  Vitals and nursing note reviewed.   Constitutional:       General: He is not in acute distress.     Appearance: Normal appearance.   HENT:      Head: Normocephalic.      Right Ear: Tympanic membrane normal.      Left Ear: Tympanic membrane normal.      Nose: Nose normal.      Mouth/Throat:      Mouth: Mucous membranes are moist.   Eyes:      General: No scleral icterus.     Extraocular Movements: Extraocular movements intact.      Conjunctiva/sclera: Conjunctivae normal.      Pupils: Pupils are equal, round, and reactive to light.   Neck:      Vascular: No carotid bruit.   Cardiovascular:      Rate and Rhythm: Normal rate and regular rhythm.       Pulses: Normal pulses.           Dorsalis pedis pulses are 2+ on the right side and 2+ on the left side.        Posterior tibial pulses are 2+ on the right side and 2+ on the left side.      Heart sounds: Normal heart sounds.   Pulmonary:      Effort: Pulmonary effort is normal.      Breath sounds: Normal breath sounds. No wheezing, rhonchi or rales.   Abdominal:      General: Abdomen is flat. Bowel sounds are normal.      Tenderness: There is no abdominal tenderness. There is no guarding.   Musculoskeletal:         General: No swelling. Normal range of motion.      Cervical back: Normal range of motion. No tenderness.      Right lower leg: No edema.      Left lower leg: No edema.   Skin:     General: Skin is warm.      Capillary Refill: Capillary refill takes less than 2 seconds.   Neurological:      General: No focal deficit present.      Mental Status: He is alert and oriented to person, place, and time.      Cranial Nerves: No cranial nerve deficit.              Health Maintenance:  Health Maintenance   Topic Date Due    Influenza Vaccine (1) 05/16/2022 (Originally 11/16/2021)    Colonoscopy  07/16/2022 (Originally 11/11/2017)    Depression Screening  11/15/2022    Meningococcal Vaccine  Aged Out    Pneumococcal Vaccine, Age 72-64  Aged Out    Adult Tdap-Td  Discontinued  Hepatitis B Vaccine  Discontinued    HIV Screening  Discontinued    Covid-19 Vaccine  Discontinued        Assessment:      ICD-10-CM    1. Nonalcoholic fatty liver disease without nonalcoholic steatohepatitis (NASH)  K76.0 Condition stable will continue current therapy.        2. Primary hypertension  I10 Blood Pressure today is stable and will continue current treatment plans     CBC     URINALYSIS, MACROSCOPIC AND MICROSCOPIC W/CULTURE REFLEX     COMPREHENSIVE METABOLIC PANEL, NON-FASTING      3. Mixed hyperlipidemia  E78.2 Labs ordered here and on this visit will be be addressed by me. I will contact patient and address therapy options once  completed.     LIPID PANEL      4. Vitamin B12 deficiency (non anemic)  E53.8 Labs ordered here and on this visit will be be addressed by me. I will contact patient and address therapy options once completed.     VITAMIN B12      5. Vitamin D deficiency  E55.9       6. Gout of multiple sites, unspecified cause, unspecified chronicity  M10.9 Labs ordered here and on this visit will be be addressed by me. I will contact patient and address therapy options once completed.       CBC     URINALYSIS, MACROSCOPIC AND MICROSCOPIC W/CULTURE REFLEX     COMPREHENSIVE METABOLIC PANEL, NON-FASTING     URIC ACID     SEDIMENTATION RATE     C-REACTIVE PROTEIN(CRP),INFLAMMATION      7. Benign prostatic hyperplasia with nocturia  N40.1 Labs ordered here and on this visit will be be addressed by me. I will contact patient and address therapy options once completed.     PSA, DIAGNOSTIC    R35.1       8. Elevated blood sugar  R73.9 Labs ordered here and on this visit will be be addressed by me. I will contact patient and address therapy options once completed.     HGA1C (HEMOGLOBIN A1C WITH EST AVG GLUCOSE)      9. Chronic fatigue  R53.82 Labs ordered here and on this visit will be be addressed by me. I will contact patient and address therapy options once completed.     THYROID STIMULATING HORMONE WITH FREE T4 REFLEX      10. Hand pain, right  M79.641 Concerning for tendonitis vs OA 1st [facility] and recommend follow up with rheumatology next week          Orders Placed This Encounter    CBC    LIPID PANEL    PSA, DIAGNOSTIC    VITAMIN B12    HGA1C (HEMOGLOBIN A1C WITH EST AVG GLUCOSE)    URINALYSIS, MACROSCOPIC AND MICROSCOPIC W/CULTURE REFLEX    COMPREHENSIVE METABOLIC PANEL, NON-FASTING    THYROID STIMULATING HORMONE WITH FREE T4 REFLEX    URIC ACID    SEDIMENTATION RATE    C-REACTIVE PROTEIN(CRP),INFLAMMATION         Follow up:  Return in about 6 months (around 10/15/2022).    This note was partially created using voice recognition  software and is inherently subject to errors including those of syntax and "sound alike " substitutions which may escape proof reading.  In such instances, original meaning may be extrapolated by contextual derivation.    Nettie Elm, DO  04/16/2022, 09:10

## 2022-04-19 ENCOUNTER — Other Ambulatory Visit (INDEPENDENT_AMBULATORY_CARE_PROVIDER_SITE_OTHER): Payer: Self-pay | Admitting: Internal Medicine

## 2022-04-19 DIAGNOSIS — E785 Hyperlipidemia, unspecified: Secondary | ICD-10-CM

## 2022-04-22 ENCOUNTER — Other Ambulatory Visit (INDEPENDENT_AMBULATORY_CARE_PROVIDER_SITE_OTHER): Payer: Self-pay | Admitting: Internal Medicine

## 2022-04-22 ENCOUNTER — Telehealth (INDEPENDENT_AMBULATORY_CARE_PROVIDER_SITE_OTHER): Payer: Self-pay | Admitting: Internal Medicine

## 2022-04-22 ENCOUNTER — Other Ambulatory Visit: Payer: Self-pay

## 2022-04-22 ENCOUNTER — Other Ambulatory Visit: Payer: 59 | Attending: Internal Medicine

## 2022-04-22 DIAGNOSIS — N401 Enlarged prostate with lower urinary tract symptoms: Secondary | ICD-10-CM

## 2022-04-22 DIAGNOSIS — I10 Essential (primary) hypertension: Secondary | ICD-10-CM

## 2022-04-22 DIAGNOSIS — M109 Gout, unspecified: Secondary | ICD-10-CM

## 2022-04-22 DIAGNOSIS — E7800 Pure hypercholesterolemia, unspecified: Secondary | ICD-10-CM | POA: Insufficient documentation

## 2022-04-22 DIAGNOSIS — R739 Hyperglycemia, unspecified: Secondary | ICD-10-CM | POA: Insufficient documentation

## 2022-04-22 DIAGNOSIS — K760 Fatty (change of) liver, not elsewhere classified: Secondary | ICD-10-CM | POA: Insufficient documentation

## 2022-04-22 DIAGNOSIS — E538 Deficiency of other specified B group vitamins: Secondary | ICD-10-CM

## 2022-04-22 DIAGNOSIS — R351 Nocturia: Secondary | ICD-10-CM | POA: Insufficient documentation

## 2022-04-22 DIAGNOSIS — E782 Mixed hyperlipidemia: Secondary | ICD-10-CM

## 2022-04-22 DIAGNOSIS — R5382 Chronic fatigue, unspecified: Secondary | ICD-10-CM

## 2022-04-22 LAB — HGA1C (HEMOGLOBIN A1C WITH EST AVG GLUCOSE): HEMOGLOBIN A1C: 5.7 % (ref 4.0–6.0)

## 2022-04-22 LAB — COMPREHENSIVE METABOLIC PANEL, NON-FASTING
ALBUMIN/GLOBULIN RATIO: 1.9 — ABNORMAL HIGH (ref 0.8–1.4)
ALBUMIN: 4.7 g/dL (ref 3.5–5.7)
ALKALINE PHOSPHATASE: 67 U/L (ref 34–104)
ALT (SGPT): 24 U/L (ref 7–52)
ANION GAP: 7 mmol/L (ref 4–13)
AST (SGOT): 20 U/L (ref 13–39)
BILIRUBIN TOTAL: 1.3 mg/dL — ABNORMAL HIGH (ref 0.3–1.2)
BUN/CREA RATIO: 17 (ref 6–22)
BUN: 22 mg/dL (ref 7–25)
CALCIUM, CORRECTED: 9.1 mg/dL (ref 8.9–10.8)
CALCIUM: 9.7 mg/dL (ref 8.6–10.3)
CHLORIDE: 107 mmol/L (ref 98–107)
CO2 TOTAL: 27 mmol/L (ref 21–31)
CREATININE: 1.28 mg/dL (ref 0.60–1.30)
ESTIMATED GFR: 69 mL/min/{1.73_m2} (ref >59)
GLOBULIN: 2.5 — ABNORMAL LOW (ref 2.9–5.4)
GLUCOSE: 91 mg/dL (ref 74–109)
OSMOLALITY, CALCULATED: 284 mOsm/kg (ref 270–290)
POTASSIUM: 3.8 mmol/L (ref 3.5–5.1)
PROTEIN TOTAL: 7.2 g/dL (ref 6.4–8.9)
SODIUM: 141 mmol/L (ref 136–145)

## 2022-04-22 LAB — URINALYSIS, MACROSCOPIC
BILIRUBIN: NEGATIVE mg/dL
BLOOD: 0.03 mg/dL
GLUCOSE: NEGATIVE mg/dL
KETONES: NEGATIVE mg/dL
LEUKOCYTES: NEGATIVE WBCs/uL
NITRITE: NEGATIVE
PH: 5 (ref 5.0–9.0)
PROTEIN: NEGATIVE mg/dL
SPECIFIC GRAVITY: 1.021 (ref 1.002–1.030)
UROBILINOGEN: NORMAL mg/dL

## 2022-04-22 LAB — LIPID PANEL
CHOL/HDL RATIO: 4.2
CHOLESTEROL: 137 mg/dL (ref <200)
HDL CHOL: 33 mg/dL (ref 23–92)
LDL CALC: 67 mg/dL (ref 0–100)
TRIGLYCERIDES: 183 mg/dL — ABNORMAL HIGH (ref <=150)
VLDL CALC: 37 mg/dL (ref 0–50)

## 2022-04-22 LAB — URIC ACID: URIC ACID: 7.8 mg/dL — ABNORMAL HIGH (ref 2.3–7.6)

## 2022-04-22 LAB — CBC
HCT: 38.9 % (ref 36.7–47.1)
HGB: 13.5 g/dL (ref 12.5–16.3)
MCH: 30.7 pg (ref 23.8–33.4)
MCHC: 34.8 g/dL (ref 32.5–36.3)
MCV: 88.4 fL (ref 73.0–96.2)
MPV: 8.2 fL (ref 7.4–11.4)
PLATELETS: 345 10*3/uL (ref 140–440)
RBC: 4.41 10*6/uL (ref 4.06–5.63)
RDW: 13.3 % (ref 12.1–16.2)
WBC: 7.8 10*3/uL (ref 3.6–10.2)

## 2022-04-22 LAB — C-REACTIVE PROTEIN(CRP),INFLAMMATION: C-REACTIVE PROTEIN (CRP): 0.6 mg/dL — ABNORMAL HIGH (ref 0.1–0.5)

## 2022-04-22 LAB — PSA, DIAGNOSTIC: PSA: 0.75 ng/mL (ref <=4.00)

## 2022-04-22 LAB — VITAMIN B12: VITAMIN B 12: 284 pg/mL (ref 180–914)

## 2022-04-22 LAB — SEDIMENTATION RATE: ERYTHROCYTE SEDIMENTATION RATE (ESR): 5 mm/hr (ref <15)

## 2022-04-22 LAB — URINALYSIS, MICROSCOPIC
RBCS: <1 /hpf (ref <4)
WBCS: 1 /hpf (ref <6)

## 2022-04-22 LAB — THYROID STIMULATING HORMONE WITH FREE T4 REFLEX: TSH: 2.05 u[IU]/mL (ref 0.450–5.330)

## 2022-04-22 MED ORDER — METOPROLOL SUCCINATE ER 25 MG TABLET,EXTENDED RELEASE 24 HR
25.0000 mg | ORAL_TABLET | Freq: Every day | ORAL | 1 refills | Status: DC
Start: 2022-04-22 — End: 2022-09-04

## 2022-04-22 MED ORDER — ALLOPURINOL 300 MG TABLET
ORAL_TABLET | ORAL | 1 refills | Status: DC
Start: 2022-04-22 — End: 2023-08-25

## 2022-04-22 NOTE — Progress Notes (Signed)
Greg Booth is a 52 y.o. male   Chief complaint:  Right wrist pain and swelling    HPI:  [name] is a 52 y.o. male patient who comes with a complaint of right wrist pain and swelling.  Patient has history of gout with intermittent joint swelling and follows of intermittently.  The patient was last evaluated on 10/29/21.  During last visit the patient was having left anterior knee pain and we discussed options and patient elected to try a higher dose oral steroid and mentions with that the pain improved and has not reoccurred.  Patient had a follow-up appointment on 02/22/2022 which he canceled.  Patient mentions that this time about 6 weeks ago, he developed pain and swelling of the right wrist mostly in the volar surface.  He mentions she did not have definitive injury but he does lives with and mentions unclear if he injured any of the tendons during that time.  He mentions he saw the primary care physician and there was discussion about possible steroid injection.  The patient mentions initially he took the prednisone taper that was given to him during last visit and mentions it helps some.  He mentions even when he was on prednisone the range of movement of the right wrist was not back.  He mentions after he completed the prednisone taper the pain and swelling worsened again and he mentions he took another round of steroids through his PCP.  That helped him somewhat but not completely and the swelling came back again after he finished the steroid.  Mentions since then he has been taking Motrin and that helps some.  He mentions he tried colchicine and it did not help a lot.  No fever.  Patient denies missing allopurinol.  He mentions that he has been taking allopurinol 300 mg daily being provided by his PCP Greg Booth and tolerating well.  He mentions no fever.  Patient came for appointment today.  He is tolerating allopurinol well.  His right elbow olecranon bursitis from gout flare improved with  injection in the past and has not reoccurred.  No skin rash.  He tolerated colchicine 0.6 mg daily in the past but when he takes 3 times a day gets diarrhea.  No other complaints.    My HPI from initial visit on 01/05/2016 is as below:  "Patient is states that he has been having gout attacks since she was 52 year old.  It used to bother him he is second and third MTP areas when he was young and every time he took colchicine it used to go away.  Over the years every time he had taken colchicine the arthritis had subsided.  Patient states that a few months ago he had attack in his right wrist which did get better with treatment with colchicine and steroid but it took longer for it to go away this time.   Even after the gout flare improved he continued to have some pain on extreme of extension of the right wrist.  He reports that he had x-ray of the hands/wrists which were reported as normal.  This time patient had injury on the right great toe which he believes started the gout flare in the right great toe (1st MTP joint).  He says he took colchicine 3 tablets a day for 2 days which did improve the joint pain and swelling but it has not completley improved the joint. He does say he got diarrhea from Colchicine and he stopped it.  Patient otherwise denies any pain in other hand joints, elbows, shoulders, knees, hips, ankles. No back pain or stiffness.  He does says he gets oral canker sores intermittently but no nasal or genital ulceration.  Patient denies any skin rash, alopecia, dry eyes, dry mouth, headache, visual change or vision loss, nasal ulcers, genital ulcers, pleuritic chest pain, raynaud's phenomenon, tingling and numbness of the extremities, hand drop/foot drop, history of blood clot.    Patient states that he used to be on Allopurinol for several years which did prevent the gout flares but later it stopped working. He says he was up to 300 mg daily dose of Allopurinol. Later it was replaced by Probenecid  but he continued to have intermittent flares."        Past Medical History:   Diagnosis Date   . Gout    . HLD (hyperlipidemia)    . Hypertension       No past surgical history on file.    Current Outpatient Medications   Medication Sig Dispense Refill   . allopurinoL (ZYLOPRIM) 100 MG tablet 400 mg daily - one tablet of 300 mg and 1 tablet of 100 mg daily 90 tablet 0   . atorvastatin (LIPITOR) 40 MG tablet Take 1 tablet (40 mg total) by mouth daily.     . clindamycin (CLEOCIN-T) 1 % external solution Apply topically.     . colchicine (MITIGARE) 0.6 mg capsule Take 1 capsule (0.6 mg total) by mouth daily. 0.6 mg daily. 90 capsule 1   . doxycycline hyclate (VIBRAMYCIN) 100 MG capsule      . fenofibrate (TRICOR) 145 MG tablet Take 1 tablet (145 mg total) by mouth daily.     . metoPROLOL succinate (TOPROL-XL) 25 MG 24 hr tablet Take 1 tablet (25 mg total) by mouth daily.     . montelukast (SINGULAIR) 10 mg tablet Take 1 tablet (10 mg total) by mouth daily as needed.     . ramipriL (ALTACE) 10 MG capsule Take 1 capsule (10 mg total) by mouth. Taking 2 tablets daily     . allopurinoL (ZYLOPRIM) 300 MG tablet 400 mg daily - one tablet of 300 mg and 1 tablet of 100 mg daily 90 tablet 0   . predniSONE (DELTASONE) 10 MG tablet 20 mg oral twice a day for 2 days, 15 mg twice a day for 2 days, 10 mg twice a day for 2 days, 10 mg daily for 2 days, 5 mg daily for 2 days and stop. 21 tablet 2     No current facility-administered medications for this visit.       Allergies   Allergen Reactions   . Penicillin Rash (ALLERGY/intolerance)   . Sulfa (Sulfonamide Antibiotics) Rash (ALLERGY/intolerance)       Social History     Tobacco Use   . Smoking status: Former     Types: Cigarettes     Quit date: 01/04/2014     Years since quitting: 8.3   . Smokeless tobacco: Never   Substance Use Topics   . Alcohol use: No   . Drug use: No       Family History   Problem Relation Age of Onset   . Cancer Mother    . Gout Father          Review of  Systems  REVIEW OF SYSTEMS : - See HPI  The remainder of a complete ROS is otherwise negative    Questionnaires:   Please see wake  one sheet.    Objective:     Vitals:    04/22/22 0955   BP: 140/78   Pulse: 78   Temp: 97.3 F (36.3 C)   SpO2: 100%   PainSc: 7-Seven (severe)   Weight: 102 kg (224 lb 13.9 oz)     Wt Readings from Last 3 Encounters:   04/22/22 102 kg (224 lb 13.9 oz)   10/29/21 105.7 kg (233 lb 0.4 oz)   04/24/20 99.4 kg (219 lb 2.2 oz)     Physical examination   General appearance: alert, appears stated age and cooperative  Eyes: no conjunctival injection, PERRL  Head: no parotid gland enlargement, and no temporal artery or scalp tenderness  Throat: no mouth ulcers and wet buccal mucosa  Neck: no adenopathy, no carotid bruit, no JVD, supple, symmetrical, trachea midline and thyroid not enlarged, symmetric, no tenderness/mass/nodules  Lungs: clear to auscultation bilaterally  Heart: regular rate and rhythm, S1, S2 normal, no murmur, click, rub or gallop  Abdomen: soft, non-tender; bowel sounds normal; no masses,  no organomegaly  Extremities: extremities normal, atraumatic, no cyanosis or edema  Pulses: 2+ and symmetric  Skin: no rash, no nodules, no tophi, no sclerodactyly, no digital pits or ulcers   Skin color, texture, turgor normal. No rashes or lesions  Lymph nodes: no cervical adenopathy  Neurologic: alert and oriented x 3, no gross motor and sensory deficits, normal gait  Joints:   He has swelling of the right wrist but mostly in the volar aspect of the wrist and not on the dorsum.  Mild warmth on the volar aspect of the wrist but not on the dorsum.  Also swelling in the tendon area immediately.    His bilateral first MTP joint has bony prominence.  Left first MTP joint mildly swollen and red but on appearance it looks like the podagra has improved a lot and almost resolved  Passive range of motion exam of both shoulders and hips= non-tender    04/22/2022:          Lab and Imaging Review: I  reviewed the labs and radiology.  Lab Results   Component Value Date    WBC 8.9 04/24/2020    HGB 14.0 04/24/2020    HCT 41.0 (L) 04/24/2020    MCV 89.4 04/24/2020    PLT 315 04/24/2020      Lab Results   Component Value Date    CREATININE 1.16 04/24/2020     Lab Results   Component Value Date    ALT 24 04/24/2020   ]      Per notes received X rays of the hands : Normal    X-ray of the bilateral feet pain/20/2017:  Right foot:  1.  Erosions about the medial aspect of the head of the first metatarsal with adjacent soft tissue swelling are consistent with gout.  2.  Joint spaces are maintained.   3.  No acute fracture or malalignment.     Left foot:  1.  Minimal erosive changes about the medial aspect of the head of the first metatarsal are consistent with gout.  2.  Joint spaces are maintained.   3.  No acute fracture or malalignment.  4.  Plantar calcaneal enthesopathy.     Assessment:   Greg Booth is a 52 y.o. Caucasian male patient who comes for follow-up for gout with acute gout flare.  We discussed the management plan in detail as below:  Plan:   Right wrist pain and  swelling on the volar aspect going on for about 6 weeks.  -He has already taken 2 rounds of prednisone taper that helped a little but did not completely take it away.  He is taking NSAIDs.  -No definitive injury but he does mention he is lifting weight.  Overall, the appearance of the wrist swelling looks little atypical for gout flare but given his history of gout flare causing this is definitely a possibility.  He does mention he lives weight.  No definitive trauma but mentions unsure if injured any of the tendons.  Prednisone did help some but not completely and he has already done 2 rounds.    We discussed several options today:  -One of the options is to do the injection of the right wrist through dorsal approach to see if that helps.  -Getting x-ray and trying to get urgent MRI, particularly as he mentions that his swelling is different  than what used to get with gout flare and its mostly in the volar aspect of the wrist particularly in the tendon area on the radial side.  Explained to the patient that I would not be able to inject that area blindly especially with the presence of nerves and vessels.  For this, orthopedic urgent evaluation to see if they can evaluate this with imaging guidance and if felt appropriate imaging guidance injection and also evaluation for any tendon injury.    Overall, after extensive discussion, the patient was set up to see orthopedic urgent care and [facility] at 1:15 PM this afternoon.  Advised the patient to let me know.  -I did extensively discussed the risk of steroid use including but not limited to avascular necrosis of the bone and prescribe 1 more prednisone taper while we await orthopedic evaluation as well.    -Typically, since patient mentions he do not miss allopurinol and his uric acid level actually had been on acceptable range, ideally should not be getting recurrent gout flare.  His last uric acid level was 11/15/2021 in [place] [facility] was 6.4 mg/dL.    He agrees to get labs today and order rheumatoid factor and CCP antibody just to make sure.    -Otherwise for now continue allopurinol 300 mg daily.  Tolerating well.  -If this were to be recurrent gout flare despite adequate suppression of the uric acid level then may have to increase allopurinol to 400 mg daily and could think about doing colchicine 0.6 mg daily prophylaxis.  Will determine further based on the results from today as well as will likely need to get uric acid level when he is not in acute flare as during flare uric acid level can be falsely low.    Discussed with the patient about the risk of long-term/high-dose steroid including but not limiting to diabetes mellitus, hypertension, weight gain, cushingoid body habitus, cataract, osteoporosis, avascular necrosis of the bone.      High risk medication monitoring encounter:  We  will repeat labs today as below.  Labs from 11/15/2021 from [facility] and stable CBC, serum creatinine and hepatic function panel  His sed rate was normal and CRP was mildly elevated at 0.7 mg/dL with normal normal range of 0.5 mg/dL.    Patient  will contact us with any new symptoms or worsening of the symptoms.    Orders Placed This Encounter   Procedures   . CBC and Differential   . Creatinine With GFR   . Hepatic Function Panel   .  Sedimentation Rate (ESR)   . CRP, Non-Cardiac   . Uric Acid   . Anti-CCP   . Rheumatoid Factor (Sendout)     Orders Placed This Encounter   Medications   . predniSONE (DELTASONE) 10 MG tablet     Sig: 20 mg oral twice a day for 2 days, 15 mg twice a day for 2 days, 10 mg twice a day for 2 days, 10 mg daily for 2 days, 5 mg daily for 2 days and stop.     Dispense:  21 tablet     Refill:  2       No follow-ups on file.   Patient has appointment on 09/30/2022 in place.  Patient to follow-up after orthopedic evaluation today.    I have personally spent  minutes involved in face-to-face and non-face-to-face activities for this patient on the day of the visit.  Professional time spent includes the following activities, in addition to those noted in the documentation: review of results, review of notes, ordering test as above, discussing post visit instructions and discussion of the management plan with the patient.     Electronically Signed by: Concepcion Living, MD  04/22/2022 5:23 PM               This note has been dictated, and transcribed using voice recognition software - homophones may occur.             Electronically signed by: Concepcion Living, MD  04/22/22 1724

## 2022-04-22 NOTE — Telephone Encounter (Signed)
Pt is calling to have a Orthopedic Referral setup, pt just saw Dr. Tamala Julian last week, they discuss in visit about orthopedic, please call pt once this is completed. Thanks Tm

## 2022-04-22 NOTE — Progress Notes (Signed)
SUBJECTIVE:  CHIEF COMPLAINT  Chief Complaint   Patient presents with   . Right Wrist - Pain     No injury ongoing for 6 weeks, has tried prednisone and it helps when he's on it. See's a RA        HISTORY OF PRESENT ILLNESS  Greg Booth is a 52 y.o. male who presents to the office today with right wrist pain.   Pain at present is 8/10.  He describes a 6 week history of insidious onset of right wrist pain.  He denies any injury or trauma.  He has seen multiple providers and has had several rounds of oral steroids.  He reports that his pain is better while he is on steroids.  When he finishes the oral steroids his pain returns.  He does note a history of gout.  He notes some mild swelling but not erythema or increased warmth to touch.  He states that it feels different compared to gout flares in the past.  UA level pending.  He denies any history of rheumatoid arthritis.  He does see a rheumatologist on a regular basis for his gout.  He takes allopurinol daily for gout prevention.  Pain is worse with activity.  He has pain and weakness with gripping.  He denies any numbness or tingling.      OBJECTIVE:  VITAL SIGNS  Vitals:    04/22/22 1304   Weight: 224 lb (102 kg)   Height: 5\' 8"  (1.727 m)       PHYSICAL EXAMINATION  General:  Well-developed, well-nourished male in no acute distress.  They are sitting comfortably in the exam room  Musculoskeletal: Right wrist is examined.  There is no obvious deformity, edema, or ecchymosis present.  He has tenderness to palpation along the volar aspect of the wrist and into the thenar eminence.  He has no pain along the dorsal aspect.  Wrist flexion and extension are limited secondary to pain.  He has no pain with pronation supination of the forearm.  He is able to make a full composite fist.  Intact FDS/FDP/EDC motion with intact EPL/FPL function.  Positive Tinel's at the carpal tunnel for pain only, no radicular symptoms.  Negative Phalen's.  Negative Finkelstein's.  Negative  CMC grind.  Hand is warm and well-perfused.  2+ radial pulse.  Brisk capillary refill.  Sensation light touch is intact at the median, radial, and ulnar nerve distribution.    RADIOGRAPHS/DIAGNOSTIC STUDIES  XR wrist 3+ views right  AP, lateral, and oblique views of the right wrist obtained in the office   today, April 22, 2022 and reviewed by me demonstrate no acute bony   abnormality.  There is some mild degenerative changes of the right wrist   but overall joint spaces are well-maintained.       ASSESSMENT:  Chronic pain of right wrist      PLAN:  Orders Placed This Encounter   . XR wrist 3+ views left   . XR wrist 3+ views right   . MRI wrist right wo contrast   . Follow-up with Me       Patient is a 52 year old male with a 6-week history of right wrist pain.  His symptoms improved when he is on oral steroids but when he stops and symptoms returned.  Symptoms and physical exam concerning for atypical presentation of carpal tunnel syndrome.  Due to his prolonged symptoms and his failure to respond to a reasonable conservative treatment, we will plan  to obtain an MRI of the right wrist for further evaluation.  He was is in agreement and would like to proceed.  He was given a prescription for oral prednisone by his rheumatologist earlier today and so he will take this as prescribed.  He will follow-up with me after the MRI to review the results and to discuss next steps.  He can use a wrist brace as needed.  He can use over-the-counter.  Patient verbalized understanding and agreement with plan.  Questions were answered.  He will call the office in the interim with any further questions or concerns.

## 2022-04-27 LAB — TESTOSTERONE, TOTAL, MS: TESTOSTERONE,TOTAL,LC/MS/MS: 303 ng/dL (ref 250–1100)

## 2022-05-20 ENCOUNTER — Encounter (INDEPENDENT_AMBULATORY_CARE_PROVIDER_SITE_OTHER): Payer: Self-pay | Admitting: Internal Medicine

## 2022-05-22 ENCOUNTER — Other Ambulatory Visit (INDEPENDENT_AMBULATORY_CARE_PROVIDER_SITE_OTHER): Payer: Self-pay | Admitting: Internal Medicine

## 2022-05-22 DIAGNOSIS — I10 Essential (primary) hypertension: Secondary | ICD-10-CM

## 2022-05-22 NOTE — Telephone Encounter (Signed)
Refilled per protocol.

## 2022-05-27 ENCOUNTER — Encounter (INDEPENDENT_AMBULATORY_CARE_PROVIDER_SITE_OTHER): Payer: Self-pay | Admitting: Internal Medicine

## 2022-05-27 ENCOUNTER — Other Ambulatory Visit: Payer: Self-pay

## 2022-05-27 ENCOUNTER — Ambulatory Visit (INDEPENDENT_AMBULATORY_CARE_PROVIDER_SITE_OTHER): Payer: 59 | Admitting: Internal Medicine

## 2022-05-27 VITALS — BP 136/80 | HR 72 | Ht 68.0 in | Wt 232.0 lb

## 2022-05-27 DIAGNOSIS — R739 Hyperglycemia, unspecified: Secondary | ICD-10-CM

## 2022-05-27 DIAGNOSIS — E538 Deficiency of other specified B group vitamins: Secondary | ICD-10-CM

## 2022-05-27 DIAGNOSIS — M25531 Pain in right wrist: Secondary | ICD-10-CM

## 2022-05-27 DIAGNOSIS — I10 Essential (primary) hypertension: Secondary | ICD-10-CM

## 2022-05-27 DIAGNOSIS — M1A09X0 Idiopathic chronic gout, multiple sites, without tophus (tophi): Secondary | ICD-10-CM

## 2022-05-27 DIAGNOSIS — K760 Fatty (change of) liver, not elsewhere classified: Secondary | ICD-10-CM

## 2022-05-27 DIAGNOSIS — M79641 Pain in right hand: Secondary | ICD-10-CM

## 2022-05-27 DIAGNOSIS — E782 Mixed hyperlipidemia: Secondary | ICD-10-CM

## 2022-05-27 MED ORDER — LIDOCAINE (PF) 10 MG/ML (1 %) INJECTION SOLUTION
1.0000 mL | Freq: Once | INTRAMUSCULAR | Status: AC
Start: 2022-05-27 — End: 2022-05-27
  Administered 2022-05-27: 10 mg via INTRAMUSCULAR

## 2022-05-27 MED ORDER — TRIAMCINOLONE ACETONIDE 40 MG/ML SUSPENSION FOR INJECTION
40.0000 mg | INTRAMUSCULAR | Status: AC
Start: 2022-05-27 — End: 2022-05-27
  Administered 2022-05-27: 40 mg via INTRA_ARTICULAR

## 2022-05-27 NOTE — Procedures (Signed)
[facility], [facility]  [address]  [place] [address]    Procedure Note    Name: CARLES WHITBY MRN:  [id number]   Date: 05/27/2022 DOB:  1971/01/28 (52 y.o.)         Med Joint Arthrocentesis: R radiocarpal  Indications: pain  Details: 22 G needle, lateral approach  Outcome: tolerated well, no immediate complications  Consent was given by the patient. Immediately prior to procedure a time out was called to verify the correct patient, procedure, equipment, support staff and site/side marked as required. Patient was prepped and draped in the usual sterile fashion.           Nettie Elm, DO

## 2022-05-27 NOTE — Progress Notes (Signed)
[facility], [facility]  [address]  [place] [address]       Name: Greg Booth MRN:  [id number]   Date: 05/27/2022 Age: 52 y.o.       Chief Complaint:    Chief Complaint   Patient presents with    Follow-up After Testing     Had labs and a right wrist x-ray done.         HPI:  Greg Booth is a 52 y.o. male who presents to the office today for follow-up.  Patient is actually doing relatively well and except for minor issues as described below which I have addressed in total with the patient.        Past Medical History:  Past Medical History:   Diagnosis Date    Change in bowel habits     Generalized anxiety disorder     Gout, unspecified     High cholesterol     HTN (hypertension)          History reviewed. No pertinent surgical history.   Current Outpatient Medications   Medication Sig    allopurinoL (ZYLOPRIM) 100 mg Oral Tablet Take 1 Tablet (100 mg total) by mouth Once a day    allopurinoL (ZYLOPRIM) 300 mg Oral Tablet 1 tablet by mouth daily    atorvastatin (LIPITOR) 40 mg Oral Tablet Take 1 tablet by mouth once daily    clindamycin phosphate (CLEOCIN T) 1 % Solution Apply topically Twice daily Apply to area twice daily topically as needed to the back of the scalp    colchicine, gout, 0.6 mg Oral Tablet Take 1 Tablet (0.6 mg total) by mouth Three times a day as needed (Gout Flares)    fenofibrate nanocrystallized (TRICOR) 145 mg Oral Tablet Take 1 Tablet (145 mg total) by mouth Every morning with breakfast    metoprolol succinate (TOPROL-XL) 25 mg Oral Tablet Sustained Release 24 hr Take 1 Tablet (25 mg total) by mouth Once a day    ramipriL (ALTACE) 10 mg Oral Capsule Take 1 capsule by mouth twice daily     Allergies   Allergen Reactions    Penicillins Rash    Sulfa (Sulfonamides) Rash       Family History:  Family Medical History:       Problem Relation (Age of Onset)    Diabetes Mother, Father    Gout Mother, Father    Hypertension (High Blood Pressure) Mother, Father     Renal Tumor Father              Social History:   Social History     Tobacco Use   Smoking Status Never    Passive exposure: Never   Smokeless Tobacco Never     Social History     Substance and Sexual Activity   Alcohol Use Yes    Comment: "Occasional"     Social History     Occupational History    Not on file       Review of Systems:  Review of systems as discussed in HPI    Problem List:  Patient Active Problem List   Diagnosis    Gout, unspecified    HTN (hypertension)    Generalized anxiety disorder    Nonalcoholic fatty liver disease without nonalcoholic steatohepatitis (NASH)    Idiopathic chronic gout of multiple sites without tophus    Vitamin D deficiency    Vitamin B12 deficiency (non anemic)    Hyperlipidemia  Annual physical exam    MRSA (methicillin resistant Staphylococcus aureus) carrier    Benign prostatic hyperplasia with nocturia    Elevated blood sugar    Chronic fatigue    Hand pain, right       Physical Examination:  BP 136/80   Pulse 72   Ht 1.727 m ('5\' 8"'$ )   Wt 105 kg (232 lb)   SpO2 98%   BMI 35.28 kg/m       Physical Exam  Vitals and nursing note reviewed.   Constitutional:       General: He is not in acute distress.     Appearance: Normal appearance.   HENT:      Head: Normocephalic.      Right Ear: Tympanic membrane normal.      Left Ear: Tympanic membrane normal.      Nose: Nose normal.      Mouth/Throat:      Mouth: Mucous membranes are moist.   Eyes:      General: No scleral icterus.     Extraocular Movements: Extraocular movements intact.      Conjunctiva/sclera: Conjunctivae normal.      Pupils: Pupils are equal, round, and reactive to light.   Neck:      Vascular: No carotid bruit.   Cardiovascular:      Rate and Rhythm: Normal rate and regular rhythm.      Pulses: Normal pulses.           Dorsalis pedis pulses are 2+ on the right side and 2+ on the left side.        Posterior tibial pulses are 2+ on the right side and 2+ on the left side.      Heart sounds: Normal heart  sounds.   Pulmonary:      Effort: Pulmonary effort is normal.      Breath sounds: Normal breath sounds. No wheezing, rhonchi or rales.   Abdominal:      General: Abdomen is flat. Bowel sounds are normal.      Tenderness: There is no abdominal tenderness. There is no guarding.   Musculoskeletal:         General: No swelling. Normal range of motion.      Cervical back: Normal range of motion. No tenderness.      Right lower leg: No edema.      Left lower leg: No edema.   Skin:     General: Skin is warm.      Capillary Refill: Capillary refill takes less than 2 seconds.   Neurological:      General: No focal deficit present.      Mental Status: He is alert and oriented to person, place, and time.      Cranial Nerves: No cranial nerve deficit.              Health Maintenance:  Health Maintenance   Topic Date Due    Influenza Vaccine (1) 06/26/2022 (Originally 11/16/2021)    Colonoscopy  07/16/2022 (Originally 11/11/2017)    Depression Screening  11/15/2022    NonMedicare Preventative Exam  11/15/2022    Meningococcal Vaccine  Aged Out    Pneumococcal Vaccine, Age 54-64  Aged Out    Adult Tdap-Td  Discontinued    Hepatitis B Vaccine  Discontinued    Hepatitis C screening  Discontinued    HIV Screening  Discontinued    Covid-19 Vaccine  Discontinued        Assessment:  ICD-10-CM    1. Primary hypertension  I10 Blood Pressure today is stable and will continue current treatment plans       2. Mixed hyperlipidemia  E78.2 Labs ordered here and on this visit will be be addressed by me. I will contact patient and address therapy options once completed.       3. Nonalcoholic fatty liver disease without nonalcoholic steatohepatitis (NASH)  K76.0 Labs ordered here and on this visit will be be addressed by me. I will contact patient and address therapy options once completed.       4. Elevated blood sugar  R73.9 Condition stable will continue current therapy.        5. Vitamin B12 deficiency (non anemic)  E53.8       6. Hand pain,  right  M79.641 Med Joint Arthrocentesis: R radiocarpal     triamcinolone acetonide (KENALOG-40) 40 mg/mL injection     lidocaine PF (XYLOCAINE-MPF) 1% injection      7. Idiopathic chronic gout of multiple sites without tophus  M1A.09X0 The patient's condition is unchanged from prior.  We have discussed whether there is need for any therapeutic changes and the patient states that they are tolerating current treatments and will just continue with current lifestyle.          Orders Placed This Encounter    Med Joint Arthrocentesis: R radiocarpal    triamcinolone acetonide (KENALOG-40) 40 mg/mL injection    lidocaine PF (XYLOCAINE-MPF) 1% injection             Follow up:  Return in about 6 weeks (around 07/08/2022).    This note was partially created using voice recognition software and is inherently subject to errors including those of syntax and "sound alike " substitutions which may escape proof reading.  In such instances, original meaning may be extrapolated by contextual derivation.    Nettie Elm, DO  05/27/2022, 08:17

## 2022-05-28 NOTE — Addendum Note (Signed)
Addended byBelva Crome on: 05/28/2022 04:53 PM     Modules accepted: Level of Service

## 2022-05-31 ENCOUNTER — Telehealth (INDEPENDENT_AMBULATORY_CARE_PROVIDER_SITE_OTHER): Payer: Self-pay | Admitting: Internal Medicine

## 2022-05-31 NOTE — Telephone Encounter (Signed)
-----   Message from Todd A Smith, DO sent at 05/30/2022  3:31 PM EDT -----  Patient needs an ANA, rheumatoid factor, anti CCP, anti double-stranded DNA, anti Smith antibody, uric acid level, sedimentation rate and CRP

## 2022-06-03 ENCOUNTER — Telehealth (INDEPENDENT_AMBULATORY_CARE_PROVIDER_SITE_OTHER): Payer: Self-pay | Admitting: Internal Medicine

## 2022-06-03 NOTE — Telephone Encounter (Signed)
-----   Message from Nettie Elm, DO sent at 05/30/2022  3:31 PM EDT -----  Patient needs an ANA, rheumatoid factor, anti CCP, anti double-stranded DNA, anti Smith antibody, uric acid level, sedimentation rate and CRP

## 2022-06-04 ENCOUNTER — Telehealth (INDEPENDENT_AMBULATORY_CARE_PROVIDER_SITE_OTHER): Payer: Self-pay | Admitting: Internal Medicine

## 2022-06-04 NOTE — Telephone Encounter (Addendum)
Called no answer no voicemail could not leave message ks,lpn        ----- Message from Nettie Elm, DO sent at 05/30/2022  3:31 PM EDT -----  Patient needs an ANA, rheumatoid factor, anti CCP, anti double-stranded DNA, anti Smith antibody, uric acid level, sedimentation rate and CRP

## 2022-06-20 ENCOUNTER — Telehealth (INDEPENDENT_AMBULATORY_CARE_PROVIDER_SITE_OTHER): Payer: Self-pay | Admitting: Internal Medicine

## 2022-06-20 NOTE — Telephone Encounter (Signed)
Dr todd recommended a hand specialist in [place]. Go ahead and set appt up

## 2022-06-24 ENCOUNTER — Other Ambulatory Visit (INDEPENDENT_AMBULATORY_CARE_PROVIDER_SITE_OTHER): Payer: Self-pay | Admitting: Internal Medicine

## 2022-06-24 DIAGNOSIS — M79641 Pain in right hand: Secondary | ICD-10-CM

## 2022-06-24 NOTE — Telephone Encounter (Signed)
Dr Emelda Fear Hydrographic surveyor in [facility]

## 2022-07-01 ENCOUNTER — Telehealth (INDEPENDENT_AMBULATORY_CARE_PROVIDER_SITE_OTHER): Payer: Self-pay | Admitting: Internal Medicine

## 2022-07-01 NOTE — Telephone Encounter (Signed)
Status on referral?

## 2022-07-09 ENCOUNTER — Other Ambulatory Visit (INDEPENDENT_AMBULATORY_CARE_PROVIDER_SITE_OTHER): Payer: Self-pay | Admitting: Internal Medicine

## 2022-07-09 DIAGNOSIS — E785 Hyperlipidemia, unspecified: Secondary | ICD-10-CM

## 2022-07-22 ENCOUNTER — Ambulatory Visit (INDEPENDENT_AMBULATORY_CARE_PROVIDER_SITE_OTHER): Payer: 59 | Admitting: Internal Medicine

## 2022-07-22 ENCOUNTER — Other Ambulatory Visit: Payer: Self-pay

## 2022-07-22 ENCOUNTER — Encounter (INDEPENDENT_AMBULATORY_CARE_PROVIDER_SITE_OTHER): Payer: Self-pay | Admitting: Internal Medicine

## 2022-07-22 ENCOUNTER — Other Ambulatory Visit: Payer: 59 | Attending: Internal Medicine | Admitting: Internal Medicine

## 2022-07-22 VITALS — BP 112/71 | HR 66 | Resp 16 | Ht 68.0 in | Wt 232.0 lb

## 2022-07-22 DIAGNOSIS — E538 Deficiency of other specified B group vitamins: Secondary | ICD-10-CM

## 2022-07-22 DIAGNOSIS — K760 Fatty (change of) liver, not elsewhere classified: Secondary | ICD-10-CM

## 2022-07-22 DIAGNOSIS — E785 Hyperlipidemia, unspecified: Secondary | ICD-10-CM

## 2022-07-22 DIAGNOSIS — I10 Essential (primary) hypertension: Secondary | ICD-10-CM

## 2022-07-22 DIAGNOSIS — M79641 Pain in right hand: Secondary | ICD-10-CM | POA: Insufficient documentation

## 2022-07-22 DIAGNOSIS — R739 Hyperglycemia, unspecified: Secondary | ICD-10-CM

## 2022-07-22 DIAGNOSIS — E559 Vitamin D deficiency, unspecified: Secondary | ICD-10-CM

## 2022-07-22 DIAGNOSIS — R5382 Chronic fatigue, unspecified: Secondary | ICD-10-CM

## 2022-07-22 LAB — SEDIMENTATION RATE: ERYTHROCYTE SEDIMENTATION RATE (ESR): 12 mm/hr (ref <15)

## 2022-07-22 LAB — C-REACTIVE PROTEIN (CRP): C-REACTIVE PROTEIN (CRP): 0.8 mg/dL — ABNORMAL HIGH (ref 0.1–0.5)

## 2022-07-22 LAB — URIC ACID: URIC ACID: 5.2 mg/dL (ref 2.3–7.6)

## 2022-07-22 MED ORDER — CLONAZEPAM 0.5 MG TABLET
0.5000 mg | ORAL_TABLET | Freq: Three times a day (TID) | ORAL | 2 refills | Status: DC
Start: 2022-07-22 — End: 2023-01-06

## 2022-07-22 MED ORDER — ATORVASTATIN 40 MG TABLET
40.0000 mg | ORAL_TABLET | Freq: Every day | ORAL | 1 refills | Status: DC
Start: 2022-07-22 — End: 2023-08-18

## 2022-07-22 NOTE — Progress Notes (Signed)
[facility]  [address]  [place] [address]       Name: Greg Booth MRN:  [id number]   Date: 07/22/2022 Age: 52 y.o.       Chief Complaint:    Chief Complaint   Patient presents with    Follow Up     On MRI results. Rt wrist pain is no better.        HPI:  Greg Booth is a 52 y.o. male who presents to the office today for follow-up.  Patient is actually doing relatively well and except for minor issues as described below which I have addressed in total with the patient.        Past Medical History:  Past Medical History:   Diagnosis Date    Change in bowel habits     Generalized anxiety disorder     Gout, unspecified     High cholesterol     HTN (hypertension)          History reviewed. No pertinent surgical history.   Current Outpatient Medications   Medication Sig    allopurinoL (ZYLOPRIM) 100 mg Oral Tablet Take 1 Tablet (100 mg total) by mouth Once a day    allopurinoL (ZYLOPRIM) 300 mg Oral Tablet 1 tablet by mouth daily    atorvastatin (LIPITOR) 40 mg Oral Tablet Take 1 Tablet (40 mg total) by mouth Once a day    clindamycin phosphate (CLEOCIN T) 1 % Solution Apply topically Twice daily Apply to area twice daily topically as needed to the back of the scalp    clonazePAM (KLONOPIN) 0.5 mg Oral Tablet Take 1 Tablet (0.5 mg total) by mouth Three times a day    colchicine, gout, 0.6 mg Oral Tablet Take 1 Tablet (0.6 mg total) by mouth Three times a day as needed (Gout Flares)    fenofibrate nanocrystallized (TRICOR) 145 mg Oral Tablet Take 1 Tablet (145 mg total) by mouth Every morning with breakfast    metoprolol succinate (TOPROL-XL) 25 mg Oral Tablet Sustained Release 24 hr Take 1 Tablet (25 mg total) by mouth Once a day    ramipriL (ALTACE) 10 mg Oral Capsule Take 1 capsule by mouth twice daily     Allergies   Allergen Reactions    Penicillins Rash    Sulfa (Sulfonamides) Rash       Family History:  Family Medical History:       Problem Relation (Age of Onset)     Diabetes Mother, Father    Gout Mother, Father    Hypertension (High Blood Pressure) Mother, Father    Renal Tumor Father              Social History:   Social History     Tobacco Use   Smoking Status Never    Passive exposure: Never   Smokeless Tobacco Never     Social History     Substance and Sexual Activity   Alcohol Use Yes    Comment: "Occasional"     Social History     Occupational History    Not on file       Review of Systems:  Review of systems as discussed in HPI    Problem List:  Patient Active Problem List   Diagnosis    Gout, unspecified    HTN (hypertension)    Generalized anxiety disorder    Nonalcoholic fatty liver disease without nonalcoholic steatohepatitis (NASH)    Idiopathic chronic  gout of multiple sites without tophus    Vitamin D deficiency    Vitamin B12 deficiency (non anemic)    Hyperlipidemia    Annual physical exam    MRSA (methicillin resistant Staphylococcus aureus) carrier    Benign prostatic hyperplasia with nocturia    Elevated blood sugar    Chronic fatigue    Hand pain, right       Physical Examination:  BP 112/71 (Site: Left, Patient Position: Sitting, Cuff Size: Adult Large)   Pulse 66   Resp 16   Ht 1.727 m (5\' 8" )   Wt 105 kg (232 lb)   SpO2 97%   BMI 35.28 kg/m       Physical Exam  Vitals and nursing note reviewed.   Constitutional:       General: He is not in acute distress.     Appearance: Normal appearance.   HENT:      Head: Normocephalic.      Right Ear: Tympanic membrane normal.      Left Ear: Tympanic membrane normal.      Nose: Nose normal.      Mouth/Throat:      Mouth: Mucous membranes are moist.   Eyes:      General: No scleral icterus.     Extraocular Movements: Extraocular movements intact.      Conjunctiva/sclera: Conjunctivae normal.      Pupils: Pupils are equal, round, and reactive to light.   Neck:      Vascular: No carotid bruit.   Cardiovascular:      Rate and Rhythm: Normal rate and regular rhythm.      Pulses: Normal pulses.           Dorsalis  pedis pulses are 2+ on the right side and 2+ on the left side.        Posterior tibial pulses are 2+ on the right side and 2+ on the left side.      Heart sounds: Normal heart sounds.   Pulmonary:      Effort: Pulmonary effort is normal.      Breath sounds: Normal breath sounds. No wheezing, rhonchi or rales.   Abdominal:      General: Abdomen is flat. Bowel sounds are normal.      Tenderness: There is no abdominal tenderness. There is no guarding.   Musculoskeletal:         General: No swelling. Normal range of motion.      Cervical back: Normal range of motion. No tenderness.      Right lower leg: No edema.      Left lower leg: No edema.   Skin:     General: Skin is warm.      Capillary Refill: Capillary refill takes less than 2 seconds.   Neurological:      General: No focal deficit present.      Mental Status: He is alert and oriented to person, place, and time.      Cranial Nerves: No cranial nerve deficit.              Health Maintenance:  Health Maintenance   Topic Date Due    Colonoscopy  Never done    Depression Screening  11/15/2022    NonMedicare Preventative Exam  11/15/2022    Influenza Vaccine (Season Ended) 11/17/2022    Meningococcal Vaccine  Aged Out    Pneumococcal Vaccine, Age 8-64  Aged Out    Adult Tdap-Td  Discontinued  Hepatitis B Vaccine  Discontinued    Hepatitis C screening  Discontinued    HIV Screening  Discontinued    Covid-19 Vaccine  Discontinued        Assessment:    Labs ordered here and on this visit will be be addressed by me. I will contact patient and address therapy options once completed.     I have reviewed the most recent labs with the patient and all questions answered to patients satisfaction.     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 Hand pain, right  M79.641 Labs ordered here and on this visit will be be addressed by me. I will contact patient and address therapy options once completed.     atorvastatin  (LIPITOR) 40 mg Oral Tablet     RHEUMATOID FACTOR, SERUM     HEP-2 SUBSTRATE ANTINUCLEAR ANTIBODIES (ANA), SERUM     CYCLIC CITRULLINATED PEPTIDE ANTIBODIES, IGG, SERUM     SEDIMENTATION RATE     C-REACTIVE PROTEIN (CRP)     LYME ANTIBODY PANEL WITH REFLEX     ANCA VASCULITIS PANEL, SERUM     SM (Kathryn Linarez) ANTIBODIES, IGG, SERUM     URIC ACID      2. Primary hypertension  I10 Blood Pressure today is stable and will continue current treatment plans       3. Hyperlipidemia, unspecified hyperlipidemia type  E78.5 atorvastatin (LIPITOR) 40 mg Oral Tablet      4. Nonalcoholic fatty liver disease without nonalcoholic steatohepatitis (NASH)  K76.0 Labs ordered here and on this visit will be be addressed by me. I will contact patient and address therapy options once completed.       5. Elevated blood sugar  R73.9 Condition stable will continue current therapy.        6. Vitamin B12 deficiency (non anemic)  E53.8 Labs ordered here and on this visit will be be addressed by me. I will contact patient and address therapy options once completed.       7. Vitamin D deficiency  E55.9 Labs ordered here and on this visit will be be addressed by me. I will contact patient and address therapy options once completed.       8. Chronic fatigue  R53.82                   Orders Placed This Encounter    RHEUMATOID FACTOR, SERUM    HEP-2 SUBSTRATE ANTINUCLEAR ANTIBODIES (ANA), SERUM    CYCLIC CITRULLINATED PEPTIDE ANTIBODIES, IGG, SERUM    SEDIMENTATION RATE    C-REACTIVE PROTEIN (CRP)    LYME ANTIBODY PANEL WITH REFLEX    ANCA VASCULITIS PANEL, SERUM    SM (Raahim Shartzer) ANTIBODIES, IGG, SERUM    URIC ACID    atorvastatin (LIPITOR) 40 mg Oral Tablet    clonazePAM (KLONOPIN) 0.5 mg Oral Tablet                    Follow up:  Return in about 1 month (around 08/22/2022).    This note was partially created using voice recognition software and is inherently subject to errors including those of syntax and "sound alike " substitutions which may escape proof  reading.  In such instances, original meaning may be extrapolated by contextual derivation.    Lucia Gaskins, DO  07/22/2022, 09:31

## 2022-07-23 LAB — PROTEINASE 3 ANTIBODIES, IGG, SERUM
PROTEINASE ANTIBODIES IGG, QUALITATIVE: NEGATIVE
PROTEINASE ANTIBODIES IGG, QUANTITATIVE: <0.2 [AU]/ml (ref <1.0)

## 2022-07-23 LAB — CYCLIC CITRULLINATED PEPTIDE ANTIBODIES, IGG, SERUM
CYCLIC CITRULLINATED PEPTIDE ANTIBODY IGG QUAL: NEGATIVE
CYCLIC CITRULLINATED PEPTIDE ANTIBODY IGG QUANT: <0.5 U/mL (ref <3.0)

## 2022-07-23 LAB — RHEUMATOID FACTOR, SERUM: RHEUMATOID FACTOR: <13 IU/mL (ref <=30)

## 2022-07-23 LAB — LYME ANTIBODY PANEL WITH REFLEX: LYME ANTIBODY TOTAL (Screen): NEGATIVE

## 2022-07-23 LAB — MYELOPEROXIDASE ANTIBODIES, IGG, SERUM
MYELOPEROXIDASE ANTIBODIES IGG QUALITATIVE: NEGATIVE
MYELOPEROXIDASE ANTIBODIES IGG QUANTITATIVE: <0.2 [AU]/ml (ref <1.0)

## 2022-07-23 LAB — SM (SMITH) ANTIBODIES, IGG, SERUM: SM ANTIBODIES, IGG, QUALITATIVE: NEGATIVE

## 2022-07-23 LAB — HEP-2 SUBSTRATE ANTINUCLEAR ANTIBODIES (ANA), SERUM: ANA INTERPRETATION: NEGATIVE

## 2022-07-29 ENCOUNTER — Encounter (INDEPENDENT_AMBULATORY_CARE_PROVIDER_SITE_OTHER): Payer: Self-pay | Admitting: Internal Medicine

## 2022-08-26 NOTE — Progress Notes (Signed)
-------------------------------------------------------------------------------    Attestation signed by Delorse Fey, MD at 08/26/2022  2:03 PM  I personally reviewed the findings and plan for this patient and agree with the plan as outlined above.  Ethan R. Wiesler, MD  Delorse Fey, MD    -------------------------------------------------------------------------------    CC: s/p    Pre-op Diagnosis:   Pre-op Diagnosis     * Pain in right wrist [M25.532]     Postoperative Diagnosis:     Post-op Diagnosis: right wrist crystalline synovitis          Procedure(s):  ARTHROSCOPY WRIST, cpt 16109 for complete synovectomy          Elgin Grit, 52 y.o., male returned for 3 week follow-up visit. Overall doing well. Pain is controlled    Objective:  Surgical incision clean and dry, no signs of infection.   No tenderness with palpation.  No signs of CRPS.    PATHOLOGY consistent with gout.    Assessment:  1. Idiopathic chronic gout of multiple sites without tophus            Plan:  Gradually progress to resume full activity without restrictions. 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  Medical management for gout crystalline arthropathy  Follow-up: ~ 4 weeks as scheduled on 7/1 w/ Dr. Mickel Alanis. Winton Haws, MD  Fellow - Hand, Elbow, Shoulder Surgery  Department of Orthopedics    Electronically signed by:  Garey Jung, MD   08/26/2022 12:53 PM

## 2022-09-04 ENCOUNTER — Other Ambulatory Visit: Payer: Self-pay

## 2022-09-04 ENCOUNTER — Ambulatory Visit (INDEPENDENT_AMBULATORY_CARE_PROVIDER_SITE_OTHER): Payer: 59 | Admitting: Internal Medicine

## 2022-09-04 ENCOUNTER — Encounter (INDEPENDENT_AMBULATORY_CARE_PROVIDER_SITE_OTHER): Payer: Self-pay | Admitting: Internal Medicine

## 2022-09-04 VITALS — BP 124/75 | HR 65 | Resp 18 | Ht 68.0 in | Wt 227.0 lb

## 2022-09-04 DIAGNOSIS — M109 Gout, unspecified: Secondary | ICD-10-CM

## 2022-09-04 DIAGNOSIS — R5382 Chronic fatigue, unspecified: Secondary | ICD-10-CM

## 2022-09-04 DIAGNOSIS — I10 Essential (primary) hypertension: Secondary | ICD-10-CM

## 2022-09-04 DIAGNOSIS — M79641 Pain in right hand: Secondary | ICD-10-CM

## 2022-09-04 DIAGNOSIS — Z79899 Other long term (current) drug therapy: Secondary | ICD-10-CM

## 2022-09-04 DIAGNOSIS — E782 Mixed hyperlipidemia: Secondary | ICD-10-CM

## 2022-09-04 DIAGNOSIS — K760 Fatty (change of) liver, not elsewhere classified: Secondary | ICD-10-CM

## 2022-09-04 DIAGNOSIS — E538 Deficiency of other specified B group vitamins: Secondary | ICD-10-CM

## 2022-09-04 DIAGNOSIS — E559 Vitamin D deficiency, unspecified: Secondary | ICD-10-CM

## 2022-09-04 DIAGNOSIS — R351 Nocturia: Secondary | ICD-10-CM

## 2022-09-04 DIAGNOSIS — R739 Hyperglycemia, unspecified: Secondary | ICD-10-CM

## 2022-09-04 DIAGNOSIS — F411 Generalized anxiety disorder: Secondary | ICD-10-CM

## 2022-09-04 MED ORDER — METOPROLOL SUCCINATE ER 25 MG TABLET,EXTENDED RELEASE 24 HR
25.0000 mg | ORAL_TABLET | Freq: Every day | ORAL | 1 refills | Status: DC
Start: 2022-09-04 — End: 2023-04-14

## 2022-09-04 NOTE — Progress Notes (Signed)
INTERNAL [facility]ICINE, [facility]  [address]  [place] [address]       Name: Greg Booth MRN:  [id number]   Date: 09/04/2022 Age: 52 y.o.       Chief Complaint:    Chief Complaint   Patient presents with    Follow Up     For chronic disease management.        HPI:  Greg Booth is a 52 y.o. male who presents to the office today for follow-up.  Patient is actually doing relatively well and except for minor issues as described below which I have addressed in total with the patient.        Past Medical History:  Past Medical History:   Diagnosis Date    Change in bowel habits     Generalized anxiety disorder     Gout, unspecified     High cholesterol     HTN (hypertension)          History reviewed. No pertinent surgical history.   Current Outpatient Medications   Medication Sig    allopurinoL (ZYLOPRIM) 300 mg Oral Tablet 1 tablet by mouth daily (Patient taking differently: 1.5 tablet by mouth daily)    atorvastatin (LIPITOR) 40 mg Oral Tablet Take 1 Tablet (40 mg total) by mouth Once a day    clindamycin phosphate (CLEOCIN T) 1 % Solution Apply topically Twice daily Apply to area twice daily topically as needed to the back of the scalp    clonazePAM (KLONOPIN) 0.5 mg Oral Tablet Take 1 Tablet (0.5 mg total) by mouth Three times a day    colchicine, gout, 0.6 mg Oral Tablet Take 1 Tablet (0.6 mg total) by mouth Three times a day as needed (Gout Flares) (Patient taking differently: Take 1 Tablet (0.6 mg total) by mouth Once a day)    fenofibrate nanocrystallized (TRICOR) 145 mg Oral Tablet Take 1 Tablet (145 mg total) by mouth Every morning with breakfast    metoprolol succinate (TOPROL-XL) 25 mg Oral Tablet Sustained Release 24 hr Take 1 Tablet (25 mg total) by mouth Once a day    ramipriL (ALTACE) 10 mg Oral Capsule Take 1 capsule by mouth twice daily     Allergies   Allergen Reactions    Penicillins Rash    Sulfa (Sulfonamides) Rash       Family History:  Family Medical History:        Problem Relation (Age of Onset)    Diabetes Mother, Father    Gout Mother, Father    Hypertension (High Blood Pressure) Mother, Father    Renal Tumor Father              Social History:   Social History     Tobacco Use   Smoking Status Never    Passive exposure: Never   Smokeless Tobacco Never     Social History     Substance and Sexual Activity   Alcohol Use Yes    Comment: "Occasional"     Social History     Occupational History    Not on file       Review of Systems:  Review of systems as discussed in HPI    Problem List:  Patient Active Problem List   Diagnosis    Gout, unspecified    HTN (hypertension)    Generalized anxiety disorder    Nonalcoholic fatty liver disease without nonalcoholic steatohepatitis (NASH)    Idiopathic chronic gout of multiple  sites without tophus    Vitamin D deficiency    Vitamin B12 deficiency (non anemic)    Hyperlipidemia    Annual physical exam    MRSA (methicillin resistant Staphylococcus aureus) carrier    Benign prostatic hyperplasia with nocturia    Elevated blood sugar    Chronic fatigue    Hand pain, right       Physical Examination:  BP 124/75 (Site: Left, Patient Position: Sitting, Cuff Size: Adult Large)   Pulse 65   Resp 18   Ht 1.727 m (5\' 8" )   Wt 103 kg (227 lb)   SpO2 97%   BMI 34.52 kg/m       Physical Exam  Vitals and nursing note reviewed.   Constitutional:       General: He is not in acute distress.     Appearance: Normal appearance.   HENT:      Head: Normocephalic.      Right Ear: Tympanic membrane normal.      Left Ear: Tympanic membrane normal.      Nose: Nose normal.      Mouth/Throat:      Mouth: Mucous membranes are moist.   Eyes:      General: No scleral icterus.     Extraocular Movements: Extraocular movements intact.      Conjunctiva/sclera: Conjunctivae normal.      Pupils: Pupils are equal, round, and reactive to light.   Neck:      Vascular: No carotid bruit.   Cardiovascular:      Rate and Rhythm: Normal rate and regular rhythm.      Pulses:  Normal pulses.           Dorsalis pedis pulses are 2+ on the right side and 2+ on the left side.        Posterior tibial pulses are 2+ on the right side and 2+ on the left side.      Heart sounds: Normal heart sounds.   Pulmonary:      Effort: Pulmonary effort is normal.      Breath sounds: Normal breath sounds. No wheezing, rhonchi or rales.   Abdominal:      General: Abdomen is flat. Bowel sounds are normal.      Tenderness: There is no abdominal tenderness. There is no guarding.   Musculoskeletal:         General: No swelling. Normal range of motion.      Cervical back: Normal range of motion. No tenderness.      Right lower leg: No edema.      Left lower leg: No edema.   Skin:     General: Skin is warm.      Capillary Refill: Capillary refill takes less than 2 seconds.   Neurological:      General: No focal deficit present.      Mental Status: He is alert and oriented to person, place, and time.      Cranial Nerves: No cranial nerve deficit.              Health Maintenance:  Health Maintenance   Topic Date Due    Colonoscopy  Never done    Depression Screening  11/15/2022    NonMedicare Preventative Exam  11/15/2022    Influenza Vaccine (Season Ended) 11/17/2022    Meningococcal Vaccine  Aged Out    Pneumococcal Vaccine, Age 70-64  Aged Out    Adult Tdap-Td  Discontinued    Hepatitis B Vaccine  Discontinued    Hepatitis C screening  Discontinued    HIV Screening  Discontinued    Covid-19 Vaccine  Discontinued        Assessment:    I have reviewed the most recent labs with the patient and all questions answered to patients satisfaction.       ICD-10-CM    1. Primary hypertension  I10 Blood Pressure today is stable and will continue current treatment plans       2. Gout of multiple sites, unspecified cause, unspecified chronicity  M10.9 The patient has had improvements in their symptoms with current treatment plans as described above.  At this point after a lengthy discussion the patient feels that they are stable  to a reasonable extent and no further treatments at this point are required.  We will obviously re-evaluate this on their next visit and I have informed them that if there is any worsening in their symptoms to call the office early for further treatment options.       3. Mixed hyperlipidemia  E78.2 Labs ordered here and on this visit will be be addressed by me. I will contact patient and address therapy options once completed.       4. Nonalcoholic fatty liver disease without nonalcoholic steatohepatitis (NASH)  K76.0 Labs ordered here and on this visit will be be addressed by me. I will contact patient and address therapy options once completed.       5. Generalized anxiety disorder  F41.1 Condition stable will continue current therapy.        6. Vitamin D deficiency  E55.9 I have reviewed the most recent labs with the patient and all questions answered to patients satisfaction.       7. High risk medication use  Z79.899       8. Vitamin B12 deficiency (non anemic)  E53.8 I have reviewed the most recent labs with the patient and all questions answered to patients satisfaction.       9. Elevated blood sugar  R73.9 I have reviewed the most recent labs with the patient and all questions answered to patients satisfaction.       10. Hand pain, right  M79.641 Suspect Gout and doing much better post op      11. Benign prostatic hyperplasia with nocturia  N40.1     R35.1          Orders Placed This Encounter    DRUG SCREEN, NO CONFIRMATION, URINE    CBC    LIPID PANEL    VITAMIN B12    URINALYSIS, MACROSCOPIC AND MICROSCOPIC W/CULTURE REFLEX    COMPREHENSIVE METABOLIC PANEL, NON-FASTING    THYROID STIMULATING HORMONE WITH FREE T4 REFLEX    PSA, DIAGNOSTIC    TESTOSTERONE    URIC ACID    metoprolol succinate (TOPROL-XL) 25 mg Oral Tablet Sustained Release 24 hr       Follow up:  Return in about 4 months (around 01/04/2023).    This note was partially created using voice recognition software and is inherently subject to  errors including those of syntax and "sound alike " substitutions which may escape proof reading.  In such instances, original meaning may be extrapolated by contextual derivation.    Lucia Gaskins, DO  09/04/2022, 11:52

## 2022-11-22 ENCOUNTER — Encounter (INDEPENDENT_AMBULATORY_CARE_PROVIDER_SITE_OTHER): Payer: Self-pay | Admitting: NURSE PRACTITIONER

## 2022-11-22 ENCOUNTER — Ambulatory Visit
Admission: RE | Admit: 2022-11-22 | Discharge: 2022-11-22 | Disposition: A | Discharge status: Home / Self Care | Payer: No Typology Code available for payment source | Source: Ambulatory Visit | Attending: NURSE PRACTITIONER | Admitting: NURSE PRACTITIONER

## 2022-11-22 ENCOUNTER — Other Ambulatory Visit: Payer: Self-pay

## 2022-11-22 ENCOUNTER — Other Ambulatory Visit (INDEPENDENT_AMBULATORY_CARE_PROVIDER_SITE_OTHER): Payer: Self-pay | Admitting: NURSE PRACTITIONER

## 2022-11-22 ENCOUNTER — Ambulatory Visit (INDEPENDENT_AMBULATORY_CARE_PROVIDER_SITE_OTHER): Payer: No Typology Code available for payment source | Admitting: NURSE PRACTITIONER

## 2022-11-22 ENCOUNTER — Ambulatory Visit (HOSPITAL_BASED_OUTPATIENT_CLINIC_OR_DEPARTMENT_OTHER)
Admission: RE | Admit: 2022-11-22 | Discharge: 2022-11-22 | Disposition: A | Discharge status: Home / Self Care | Payer: No Typology Code available for payment source | Source: Ambulatory Visit | Attending: NURSE PRACTITIONER | Admitting: NURSE PRACTITIONER

## 2022-11-22 ENCOUNTER — Other Ambulatory Visit (INDEPENDENT_AMBULATORY_CARE_PROVIDER_SITE_OTHER): Payer: Self-pay | Admitting: Internal Medicine

## 2022-11-22 VITALS — BP 142/75 | HR 76 | Resp 18 | Ht 67.99 in | Wt 231.0 lb

## 2022-11-22 DIAGNOSIS — M546 Pain in thoracic spine: Secondary | ICD-10-CM

## 2022-11-22 DIAGNOSIS — M542 Cervicalgia: Secondary | ICD-10-CM | POA: Insufficient documentation

## 2022-11-22 DIAGNOSIS — I10 Essential (primary) hypertension: Secondary | ICD-10-CM

## 2022-11-22 DIAGNOSIS — M47812 Spondylosis without myelopathy or radiculopathy, cervical region: Secondary | ICD-10-CM

## 2022-11-22 MED ORDER — DEXAMETHASONE SODIUM PHOSPHATE 4 MG/ML INJECTION SOLUTION
8.0000 mg | INTRAMUSCULAR | Status: AC
Start: 2022-11-22 — End: 2022-11-22
  Administered 2022-11-22: 8 mg via INTRAMUSCULAR

## 2022-11-22 MED ORDER — RAMIPRIL 10 MG CAPSULE
10.0000 mg | ORAL_CAPSULE | Freq: Two times a day (BID) | ORAL | 0 refills | Status: DC
Start: 2022-11-22 — End: 2023-05-23

## 2022-11-22 MED ORDER — METHOCARBAMOL 750 MG TABLET
750.0000 mg | ORAL_TABLET | Freq: Three times a day (TID) | ORAL | 0 refills | Status: AC
Start: 2022-11-22 — End: ?

## 2022-11-22 NOTE — Progress Notes (Signed)
 Chief complaint:   Right wrist pain and swelling     HPI:  [name] is a 52 y.o. male patient who comes with a complaint of right wrist pain and swelling.  Patient has history of gout with intermittent joint swelling and follows of intermittently.  The patient was last evaluated on  04/22/22.  The patient was having significant issues with recurrent inflammation of the right wrist.  MRI of the right wrist from 05/25/2022 showed  He was evaluated by hand orthopedics and underwent right wrist arthroscopy through Dr. Maile Score and based on last report from 08/01/2022, patient has synovial tissue debrided and sent for analysis which showed crystals.  "1. Small mildly complicated effusions and synovitis with tiny cysts or erosions scattered in the carpals. This could indicate an inflammatory arthropathy in the appropriate clinical setting. Degenerative etiology also a possibility.   2. No ligament or tendon tear. Minimal flexor carpi radialis tenosynovitis."  Right wrist was his beggest thing. He had arthroscopoy of the right wrist aounr May 2024 and showed gout crystal.  We increased the Allopurinol  to 450 mg daily and he is tolerating that well. He mentions some chroninc swerlling of the right wrsit but it has not become red hot swollen like before.  He mentions for last 3 weeks he has been having pain in the mid back but some pain in the neck muscles.   He had X rays done throuhg PCP. 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 Also at C5-6.  T spinemild DJD at several levels in mid thoracic vetebra.  He mentions his PCP has ordered Mri Waiting on MRI of the spine to be approved.  He mentions he was given muscle relaxer and IM steroid and mentions lat Friday, Saturday and Sunday felt like a new man but meitons today woke up and feels stiff again.  He mentions he took Colchicine  daily but later it got expensie and only taking if really needed.  He mentions he takes Allopurinol   dialy though.  He dneis any gout flare.    He mentions he still intermittently continue to take ibuprofen.  No fever.   Patient denies missing allopurinol .   He mentions that he has been taking allopurinol  300 mg daily being provided by his PCP Dr. Felipe Horton and tolerating well.   He mentions no fever.   Patient came for appointment today.   He is tolerating allopurinol  well.   No skin rash.   No other complaints.      My HPI from initial visit on 01/05/2016 is as below:   "Patient is states that he has been having gout attacks since she was 52 year old.  It used to bother him he is second and third MTP areas when he was young and every time he took colchicine  it  used to go away.  Over the years every time he had taken colchicine  the arthritis had subsided.   Patient states that a few months ago he had attack in his right wrist which did get better with treatment with colchicine  and steroid but it took longer for it to go away this time.    Even after the gout flare improved he continued to have some pain on extreme of extension of the right wrist.  He reports that he had x-ray of the hands/wrists which were reported as normal.  This time patient had injury on the right great toe which he  believes started the gout flare in the right  great toe (1st MTP joint).  He says he took colchicine  3 tablets a day for 2 days which did improve the joint pain and swelling but it has not completley improved the joint. He does say he got diarrhea from  Colchicine  and he stopped it.   Patient otherwise denies any pain in other hand joints, elbows, shoulders, knees, hips, ankles. No back pain or stiffness.   He does says he gets oral canker sores intermittently but no nasal or genital ulceration.   Patient denies any skin rash, alopecia, dry eyes, dry mouth, headache, visual change or vision loss, nasal ulcers, genital ulcers, pleuritic chest pain, raynaud's phenomenon, tingling and numbness of the extremities, hand drop/foot drop,  history of blood  clot.      Patient states that he used to be on Allopurinol  for several years which did prevent the gout flares but later it stopped working. He says he was up to 300 mg daily dose of Allopurinol . Later it was replaced by Probenecid but he continued to have intermittent  flares."      I reviewed the following elements in the electronic chart: Problem List, Medications, Allergies, History (Medical, Surgical, Tobacco, Family)     CURRENT MEDICATIONS:  Current Outpatient Medications   Medication Sig Dispense Refill   . allopurinoL  (ZYLOPRIM ) 300 mg tablet Take 1.5 tablets (450 mg total) by mouth daily. 135 tablet 1   . atorvastatin  (LIPITOR) 40 mg tablet Take 40 mg by mouth Once Daily.     . clindamycin  (CLEOCIN  T) 1 % solution Apply  topically.     . clonazePAM  (KlonoPIN ) 0.5 mg tablet Take 0.5 mg by mouth at bedtime.     . colchicine  (MITIGARE ) 0.6 mg cap capsule 0.6 mg daily . Hold for diarrhea 90 capsule 1   . doxycycline  (VIBRAMYCIN ) 100 mg capsule      . fenofibrate  nanocrystallized (TRICOR ) 145 mg tablet Take 145 mg by mouth Once Daily.     Aaron Aas HYDROcodone-acetaminophen (NORCO) 5-325 mg per tablet Take 1 tablet by mouth every 6 (six) hours as needed for moderate pain (4-6) for up to 10 doses. 10 tablet 0   . methocarbamoL  (ROBAXIN ) 750 mg tablet Take 750 mg by mouth.     . metoprolol  succinate (TOPROL  XL) 25 mg 24 hr tablet Take 25 mg by mouth Once Daily.     . montelukast (SINGULAIR) 10 mg tablet Take 10 mg by mouth daily as needed.     . ramipriL  (ALTACE ) 10 mg capsule Take 10 mg by mouth.     . predniSONE  (DELTASONE ) 5 mg tablet 20 mg daily for 3 days, 15 mg daily for 3 days, 10 mg daily for 3 days and the 5 mg daily for 1 month, 2.5 mg daily for 1 week, 2.5 mg every other day for 1 week and stop (Patient not taking: Reported on 11/25/2022) 60 tablet 1     No current facility-administered medications for this visit.       Allergies   Allergen Reactions   . Penicillin Rash   . Sulfa (Sulfonamide  Antibiotics) Rash          Review of Systems   REVIEW OF SYSTEMS : - See HPI   The remainder of a complete ROS is otherwise negative      Questionnaires:    Please see wake one sheet.          Objective:       Vitals:    11/25/22 1332  BP: 127/61   Pulse: 81   Temp: 97.2 F (36.2 C)   TempSrc: Temporal   SpO2: 98%   Weight: 106 kg (233 lb 11 oz)       Wt Readings from Last 3 Encounters:   11/25/22 106 kg (233 lb 11 oz)   08/26/22 105 kg (232 lb 1.6 oz)   08/01/22 106 kg (233 lb 11 oz)    Physical examination    General appearance: alert, appears stated age and cooperative   Eyes: no conjunctival injection, PERRL   Head: no parotid gland enlargement, and no temporal artery or scalp tenderness   Throat: no mouth ulcers and wet buccal mucosa   Neck: no adenopathy, no carotid bruit, no JVD, supple, symmetrical, trachea midline and thyroid  not enlarged, symmetric, no tenderness/mass/nodules   Lungs: clear to auscultation bilaterally   Heart: regular rate and rhythm, S1, S2 normal, no murmur, click, rub or gallop   Abdomen: soft, non-tender; bowel sounds normal; no masses,  no organomegaly   Extremities: extremities normal, atraumatic, no cyanosis or edema   Pulses: 2+ and symmetric   Skin: no rash, no nodules, no tophi, no sclerodactyly, no digital pits or ulcers    Skin color, texture, turgor normal. No rashes or lesions   Lymph nodes: no cervical adenopathy   Neurologic: alert and oriented x 3, no gross motor and sensory deficits, normal gait   Joints:   Right wrist arthroscopy scar healed well.  Soft tissue swelling but no warmth redness or tenderness.    His bilateral first MTP joint has bony prominence.   Left first MTP joint mildly swollen and red but on appearance it looks like the podagra has improved a lot and almost resolved   Passive range of motion exam of both shoulders and hips= non-tender      04/22/2022:                 Lab and Imaging Review:       Per notes received X rays of the hands : Normal       X-ray of the bilateral feet pain/20/2017:   Right foot:   1.  Erosions about the medial aspect of the head of the first metatarsal with adjacent soft tissue swelling are consistent with gout.   2.  Joint spaces are maintained.    3.  No acute fracture or malalignment.       Left foot:   1.  Minimal erosive changes about the medial aspect of the head of the first metatarsal are consistent with gout.   2.  Joint spaces are maintained.    3.  No acute fracture or malalignment.   4.  Plantar calcaneal enthesopathy.           Assessment:   Mr. Marik Sedore is a 52 y.o. Caucasian male  patient who comes for follow-up for gout with acute gout flare.   We discussed the management plan in detail as below:       Plan:    Right wrist pain and swelling on the volar aspect going on for about 6 weeks.  MRI of the right wrist from 05/25/2022 showed effusion, synovitis with cyst or erosion scattered in the carpals.  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  Differential diagnosis of rheumatoid arthritis, gout, trauma infection and other autoimmune disease were provided.    Overall, his rheumatoid factor CCP antibody for rheumatoid arthritis  negative.  No psoriasis to suggest psoriatic arthritis.  No IBD symptoms to suggest IBD associated arthritis.    Overall, we discussed and increased his allopurinol  to 450 mg daily which she is tolerating well.  Will repeat labs today when will try to target uric acid level to 5 mg/dL or lower.    He denies any gout flare currently.  Allopurinol  hypersensitivity reaction risk has been discussed and advised to stop the medicine if any diffuse skin rash and let us  know.  He has prophylactic colchicine  to take if needed for flare.  Also a steroid taper can be done.    In terms of her 3-week of back pain and stiffness, C-spine showed moderate degenerative arthritis and thoracic spine showed mild degenerative  arthritis.  He mentions intramuscular steroid given by his PCP helped a lot but symptoms reoccurring.  He is waiting on MRI of the spine.  Overall, in the past patient has not had inflammatory type back pain.  Will get SI joint x-ray.  Will await for the MRI of the spine finding.  Overall, gout can affect the spine but less commonly particularly not for only 3 weeks especially when his right wrist is under control now.  Possibility of superimposed autoimmune inflammatory arthritis cannot completely be ruled out but less suspicious.  Patient will update with the MRI.              Discussed with the patient about the risk of long-term/high-dose steroid including but not limiting to diabetes mellitus, hypertension, weight gain, cushingoid body habitus, cataract, osteoporosis, avascular necrosis of the bone.         High risk medication monitoring encounter:   We will repeat labs today as below.  I have reviewed the labs below, stable    Lab Results   Component Value Date    WBC 11.40 (H) 11/25/2022    HGB 14.0 11/25/2022    HCT 42.1 11/25/2022    MCV 89.1 11/25/2022    PLT 385 11/25/2022       Lab Results   Component Value Date    CREATININE 1.28 11/25/2022       Lab Results   Component Value Date    ALT 36 11/25/2022    AST 20 11/25/2022    ALP 73 11/25/2022    BILITOT 0.6 11/25/2022       Patient  will contact us  with any new symptoms or worsening of the symptoms.     Orders Placed This Encounter   Procedures   . XR Sacroiliac Jts Less Than 3 View     Standing Status:   Future     Number of Occurrences:   1     Standing Expiration Date:   11/25/2023     Order Specific Question:   Clinical Indications for Exam:     Answer:   arthritis     Order Specific Question:   Release to patient:     Answer:   Immediate     Order Specific Question:   Imaging Specific Protocol?     Answer:   Ferguesson's view for SI joint   . CBC with Differential     Standing Status:   Future     Number of Occurrences:   1     Order Specific  Question:   Release to patient:     Answer:   Immediate   . Comprehensive Metabolic Panel     Standing Status:   Future  Number of Occurrences:   1     Order Specific Question:   Release to patient:     Answer:   Immediate   . Uric Acid     Standing Status:   Future     Number of Occurrences:   1     Standing Expiration Date:   11/25/2023     Order Specific Question:   Release to patient:     Answer:   Immediate   . CBC with Differential     Standing Status:   Standing     Number of Occurrences:   6     Standing Expiration Date:   11/25/2023     Order Specific Question:   Release to patient:     Answer:   Immediate   . Comprehensive Metabolic Panel     Standing Status:   Standing     Number of Occurrences:   6     Standing Expiration Date:   11/25/2023     Order Specific Question:   Release to patient:     Answer:   Immediate   . Uric Acid     Standing Status:   Standing     Number of Occurrences:   6     Standing Expiration Date:   11/25/2023     Order Specific Question:   Release to patient:     Answer:   Immediate   . Urinalysis with Reflex to Microscopic     Standing Status:   Future     Number of Occurrences:   1     Order Specific Question:   Release to patient:     Answer:   Immediate   . Protein, Total, Random Urine     Standing Status:   Future     Number of Occurrences:   1     Order Specific Question:   Release to patient:     Answer:   Immediate   . CBC with Differential     Order Specific Question:   Release to patient:     Answer:   Immediate   . Urinalysis, Microscopic Only     Order Specific Question:   Release to patient:     Answer:   Immediate       No orders of the defined types were placed in this encounter.      Return in about 6 months (around 05/25/2023) for Dr Aretha Kubas.    Patient will contact us  with any new symptoms or worsening of the symptoms in the interim.    I have personally spent  minutes involved in face-to-face and non-face-to-face activities for this patient on the day of the visit.   Professional time spent includes the following activities, in addition to those noted in the documentation: review of results, review of notes, care coordination, ordering test as above, discussing post visit instructions and discussion of the management plan with the patient as above.    Electronically Signed by: Jeralyn Mon, MD  11/25/2022 5:41 PM     Attending Physician      This note has been dictated, and transcribed using voice recognition software

## 2022-11-22 NOTE — Progress Notes (Signed)
INTERNAL MEDICINE, [facility]  [address]  [place] [address]      Acute Office Visit      NAME: Greg Booth DOS: 11/22/2022   MRN: [id number] DOB: 10-31-1970     Chief Complaint: Stiff Neck (Pt has c/o of neck stiffness and stiffness along the thoracic area of the spine. Denies any injury. States it started 3 weeks ago. Says the pain is better when is he up and moving and worsens at rest. )      History of Present Illness: Greg Booth is a 52 y.o. male who comes in today with complaints of increased pain/stiffness to his cervical and thoracic areas of his spine that has worsened over the last 3 weeks. He informs that one night the pain was so bad that it woke him up from his sleep. He states the pain is worse when laying down and seems to ease up as he gets up and moving.   He informs that he has gotten back into being more active over the last month or so but denies any specific injury that he is aware of.       No other acute concerns or complaints at this time.    Medical History     I have reviewed and updated as appropriate the past medical, surgical, family, and social history today:  Medical History/Surgical History/Family History/Social History    Allergies:  Allergies   Allergen Reactions    Penicillins Rash    Sulfa (Sulfonamides) Rash       Medications:  Current Outpatient Medications   Medication Sig    allopurinoL (ZYLOPRIM) 300 mg Oral Tablet 1 tablet by mouth daily (Patient taking differently: 1.5 tablet by mouth daily)    atorvastatin (LIPITOR) 40 mg Oral Tablet Take 1 Tablet (40 mg total) by mouth Once a day    clindamycin phosphate (CLEOCIN T) 1 % Solution Apply topically Twice daily Apply to area twice daily topically as needed to the back of the scalp    clonazePAM (KLONOPIN) 0.5 mg Oral Tablet Take 1 Tablet (0.5 mg total) by mouth Three times a day (Patient not taking: Reported on 11/22/2022)    colchicine, gout, 0.6 mg Oral Tablet Take 1 Tablet (0.6 mg  total) by mouth Three times a day as needed (Gout Flares) (Patient taking differently: Take 1 Tablet (0.6 mg total) by mouth Once a day)    fenofibrate nanocrystallized (TRICOR) 145 mg Oral Tablet Take 1 Tablet (145 mg total) by mouth Every morning with breakfast    methocarbamoL (ROBAXIN) 750 mg Oral Tablet Take 1 Tablet (750 mg total) by mouth Three times a day Indications: muscle spasm    metoprolol succinate (TOPROL-XL) 25 mg Oral Tablet Sustained Release 24 hr Take 1 Tablet (25 mg total) by mouth Once a day    ramipriL (ALTACE) 10 mg Oral Capsule Take 1 Capsule (10 mg total) by mouth Twice daily       Problem List:  Patient Active Problem List    Diagnosis Date Noted    Benign prostatic hyperplasia with nocturia 04/16/2022    Elevated blood sugar 04/16/2022    Chronic fatigue 04/16/2022    Hand pain, right 04/16/2022    MRSA (methicillin resistant Staphylococcus aureus) carrier 01/14/2022    Hyperlipidemia 11/14/2021    Annual physical exam 11/14/2021    Vitamin D deficiency 11/13/2021    Vitamin B12 deficiency (non anemic) 11/13/2021    Gout, unspecified 08/16/2021  HTN (hypertension) 08/16/2021    Generalized anxiety disorder 08/16/2021    Nonalcoholic fatty liver disease without nonalcoholic steatohepatitis (NASH) 08/16/2021    Idiopathic chronic gout of multiple sites without tophus 11/10/2017       Objective   Vitals:  BP (!) 142/75 (Site: Left Arm, Patient Position: Sitting, Cuff Size: Adult Large)   Pulse 76   Resp 18   Ht 1.727 m (5' 7.99")   Wt 105 kg (231 lb)   SpO2 97%   BMI 35.13 kg/m       Physical Exam:  Physical Exam  Vitals and nursing note reviewed.   Constitutional:       General: He is not in acute distress.     Appearance: Normal appearance. He is not ill-appearing or diaphoretic.   HENT:      Head: Normocephalic and atraumatic.   Cardiovascular:      Rate and Rhythm: Normal rate and regular rhythm.      Heart sounds: S1 normal and S2 normal. No murmur heard.  Pulmonary:       Effort: Pulmonary effort is normal.      Breath sounds: Normal breath sounds.   Abdominal:      General: Bowel sounds are normal.      Palpations: Abdomen is soft.   Musculoskeletal:      Cervical back: Muscular tenderness present. Decreased range of motion.      Thoracic back: Tenderness present. Decreased range of motion.   Skin:     General: Skin is warm and dry.      Capillary Refill: Capillary refill takes less than 2 seconds.   Neurological:      General: No focal deficit present.      Mental Status: He is alert and oriented to person, place, and time.            Assessment/Plan     Diagnosis/Plan:  Problem List Items Addressed This Visit          Cardiovascular System    HTN (hypertension) (Chronic)    Relevant Medications    ramipriL (ALTACE) 10 mg Oral Capsule     Other Visit Diagnoses       Cervical pain (neck)    -  Primary    Relevant Medications    methocarbamoL (ROBAXIN) 750 mg Oral Tablet    dexAMETHasone 4 mg/mL injection (Start on 11/22/2022  9:30 AM)    Other Relevant Orders    XR CERVICAL SPINE COMPLETE (4 OR MORE VIEWS)    Thoracic back pain        Relevant Medications    methocarbamoL (ROBAXIN) 750 mg Oral Tablet    dexAMETHasone 4 mg/mL injection (Start on 11/22/2022  9:30 AM)    Other Relevant Orders    XR THORACIC SPINE 3 VIEWS           Educated patient this is likely muscular/strain due to increase in activity over the last several weeks. Will xray to r/o injury, and treat conservatively with heat to area as needed, muscle relaxer as needed and NSAID'S as needed. Patient will be contacted with results of xray.   If symptoms worsen/persist, call back to office, come back in for further evaluation,.     Encounter Medications and Orders  Orders Placed This Encounter    XR CERVICAL SPINE COMPLETE (4 OR MORE VIEWS)    XR THORACIC SPINE 3 VIEWS    ramipriL (ALTACE) 10 mg Oral Capsule    methocarbamoL (ROBAXIN) 750  mg Oral Tablet    dexAMETHasone 4 mg/mL injection       Return if symptoms worsen or  fail to improve.      Macario Golds, FNP-C  11/22/2022 09:02     This note was partially created using M*Modal fluency direct system (voice recognition software ) and is inherently subject to errors including those of syntax and "sound- alike" substitutions which may escape proofreading.  In such instances, original meaning may be extrapolated by contextual derivation.Marland Kitchen

## 2022-11-26 ENCOUNTER — Encounter (INDEPENDENT_AMBULATORY_CARE_PROVIDER_SITE_OTHER): Payer: Self-pay

## 2022-11-26 ENCOUNTER — Other Ambulatory Visit (INDEPENDENT_AMBULATORY_CARE_PROVIDER_SITE_OTHER): Payer: Self-pay | Admitting: NURSE PRACTITIONER

## 2022-11-26 MED ORDER — LORAZEPAM 1 MG TABLET
1.0000 mg | ORAL_TABLET | Freq: Once | ORAL | 0 refills | Status: AC
Start: 2022-11-26 — End: 2022-11-26

## 2022-12-03 ENCOUNTER — Telehealth (INDEPENDENT_AMBULATORY_CARE_PROVIDER_SITE_OTHER): Payer: Self-pay | Admitting: Internal Medicine

## 2022-12-03 NOTE — Telephone Encounter (Signed)
Patient notified per Dr Katrinka Blazing prednisone will not affect results of MRI  he voiced understanding ks,lpn

## 2022-12-03 NOTE — Telephone Encounter (Signed)
Pt mri was rescheduled from September 13 to September 20 due to the machine was down.    His rheumatologist has him on some predinsone for his gout. Should he reschedule his mri until he finishes the predinsone? The patient does not want the steroid to interfere with his reading on his neck.

## 2022-12-12 ENCOUNTER — Other Ambulatory Visit (INDEPENDENT_AMBULATORY_CARE_PROVIDER_SITE_OTHER): Payer: Self-pay | Admitting: Internal Medicine

## 2022-12-12 ENCOUNTER — Encounter (INDEPENDENT_AMBULATORY_CARE_PROVIDER_SITE_OTHER): Payer: Self-pay | Admitting: Internal Medicine

## 2022-12-12 DIAGNOSIS — R937 Abnormal findings on diagnostic imaging of other parts of musculoskeletal system: Secondary | ICD-10-CM

## 2022-12-12 DIAGNOSIS — M47812 Spondylosis without myelopathy or radiculopathy, cervical region: Secondary | ICD-10-CM

## 2022-12-12 DIAGNOSIS — M4802 Spinal stenosis, cervical region: Secondary | ICD-10-CM

## 2022-12-12 DIAGNOSIS — M542 Cervicalgia: Secondary | ICD-10-CM

## 2022-12-12 IMAGING — MR MRI CERVICAL SPINE WITHOUT CONTRAST
4 of 5 series · 23 of 48 positions shown · IV contrast (gadolinium)
Comparison: None available.

﻿EXAM:  81000   MRI CERVICAL SPINE WITHOUT CONTRAST
INDICATION: 52-year-old with chronic neck pain and bilateral shoulder and arm pain. No history of C-spine surgery.
TECHNIQUE: Multiplanar, multisequential MRI of the C-spine was performed without gadolinium contrast.

[Series 5: T2 · sagittal · 3.0mm · 0.75mm/px · 8 of 13 slices shown (1 of 2)]
[im 1/13]
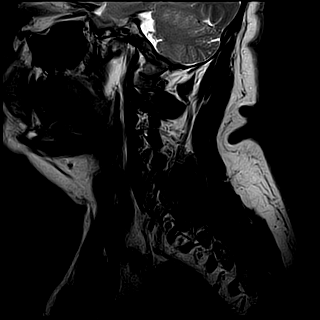
[im 2/13]
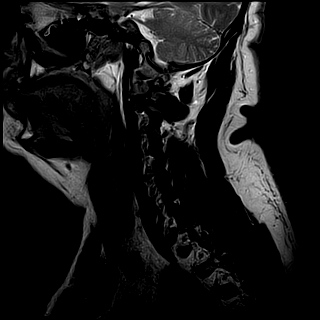
[im 4/13]
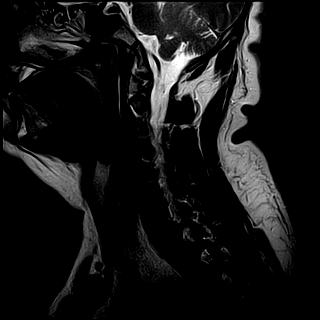
[im 6/13]
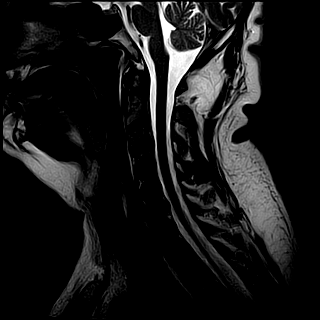
[im 7/13]
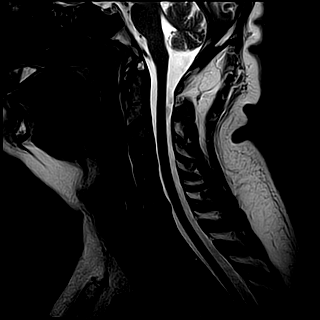
[im 9/13]
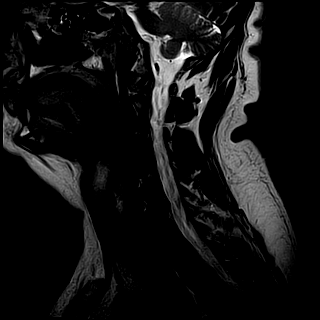
[im 11/13]
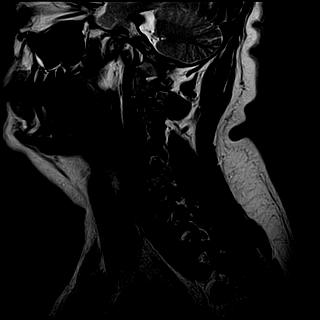
[im 13/13]
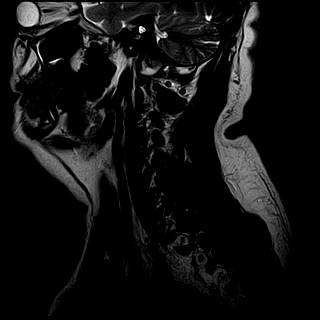

[Series 6: T1 · sagittal · 3.0mm · 0.47mm/px · 3 of 13 slices shown]
[im 2/13]
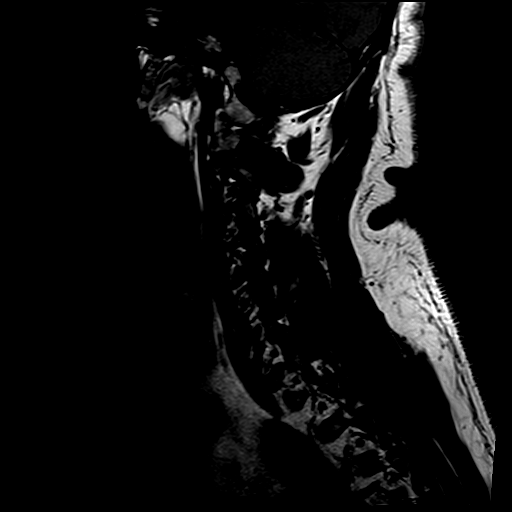
[im 7/13]
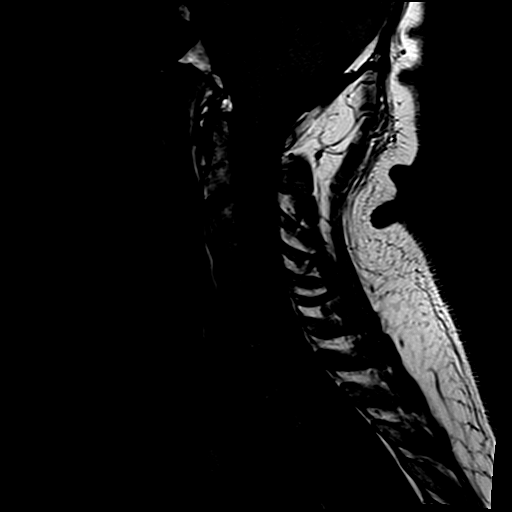
[im 11/13]
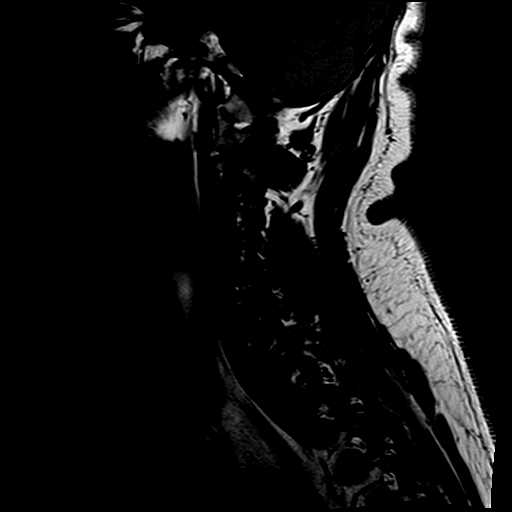

[Series 7: STIR · sagittal · 3.0mm · 0.47mm/px · 3 of 13 slices shown]
[im 2/13]
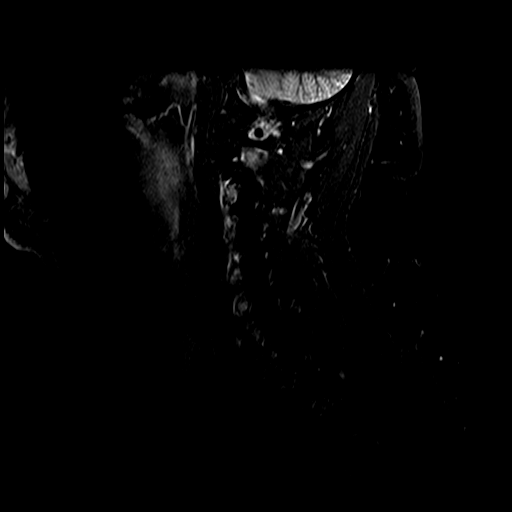
[im 7/13]
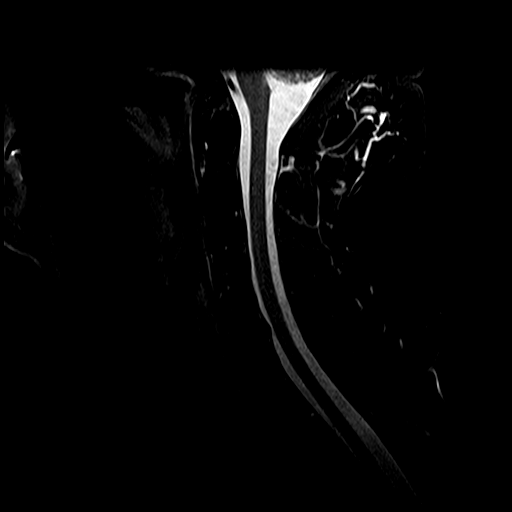
[im 11/13]
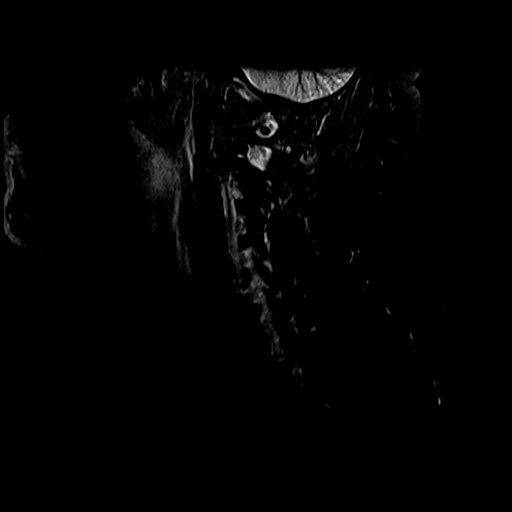

[Series 8: T2 · axial · 3.0mm · 0.39mm/px · z∈[-118,-26]mm · 9 of 18 slices shown (2 of 2)]
[im 1/18]
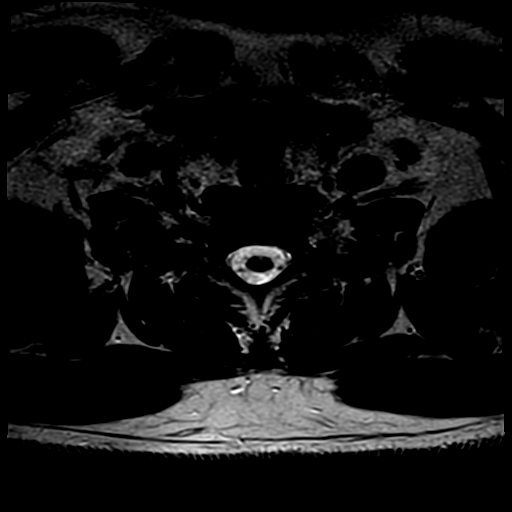
[im 4/18]
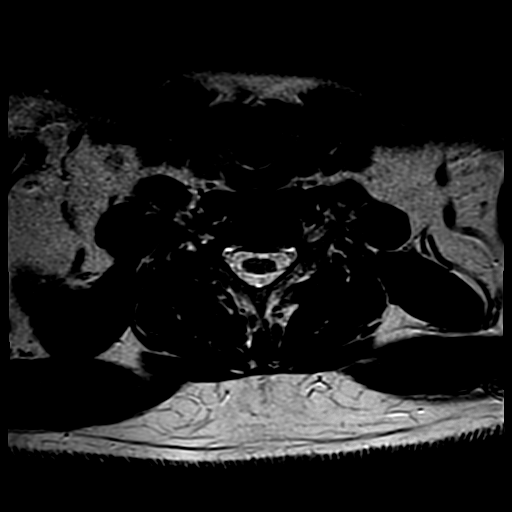
[im 5/18]
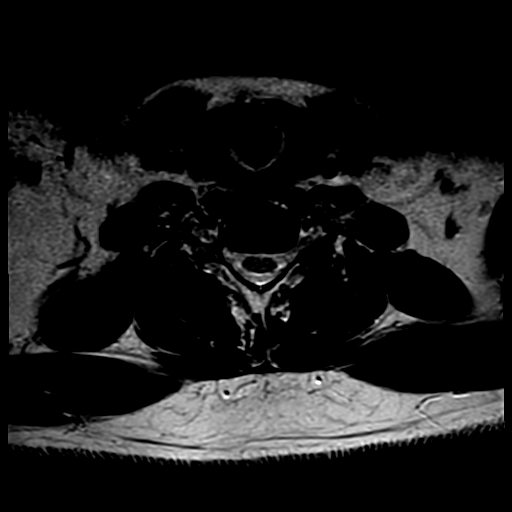
[im 8/18]
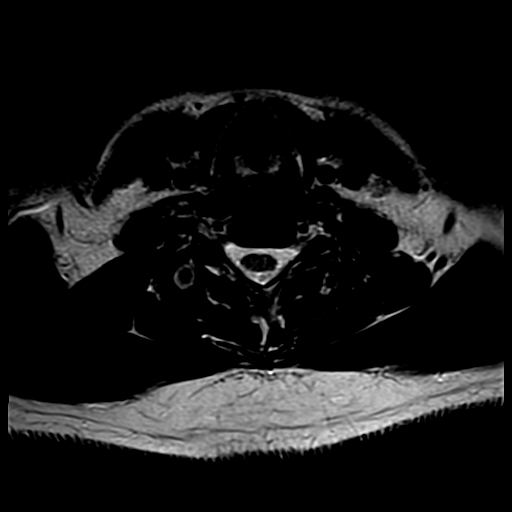
[im 10/18]
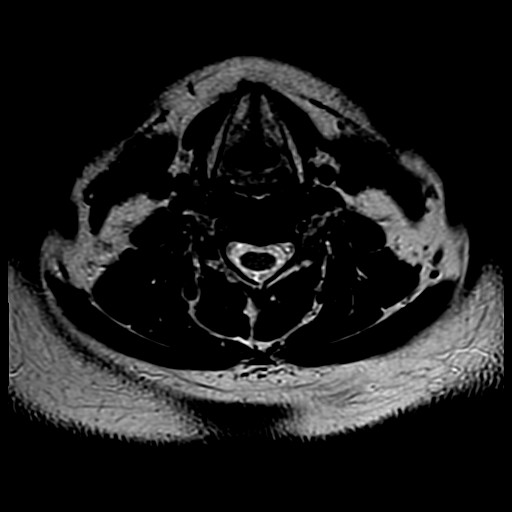
[im 13/18]
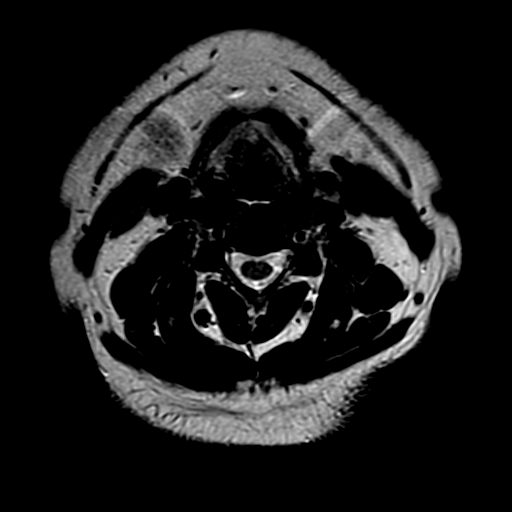
[im 14/18]
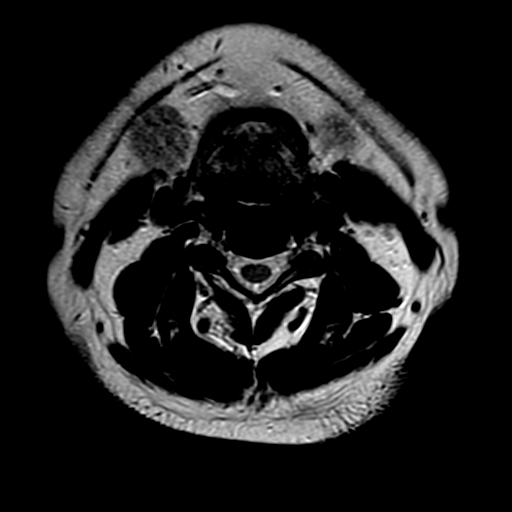
[im 16/18]
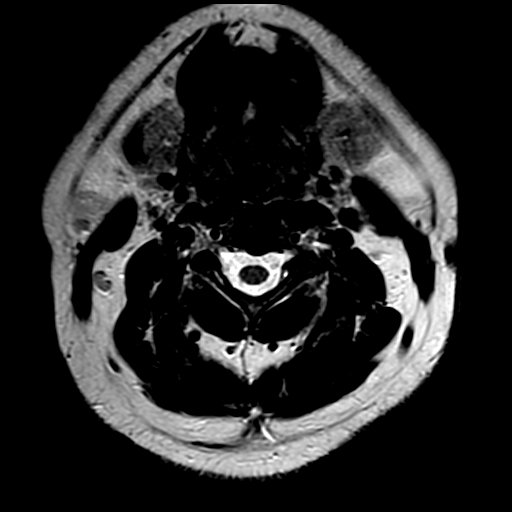
[im 18/18]
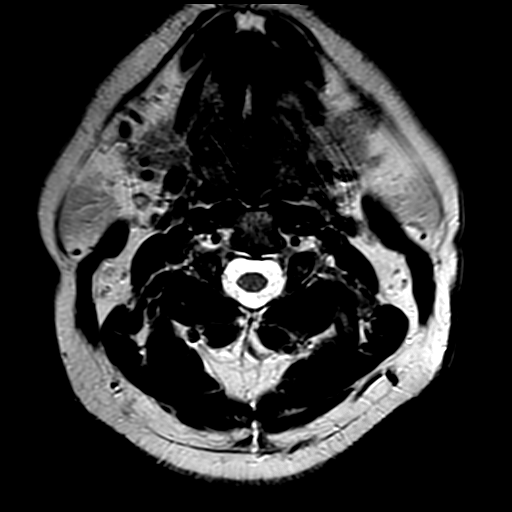

[23 of 48 positions shown; findings below may reference images not displayed]

FINDINGS: No acute bony lesions of cervical vertebrae.  Structures at the foramen magnum are normal in the sagittal view. 

At C2-3 level, no focal disc lesions are seen.

At C3-4 level, no focal disc lesions are seen.  C4-5 disc shows minimal bulging annulus in the midline without compromise of thecal sac or neural foramina.

At C5-6 level, no focal disc lesions are seen. 

At C6-7 level, degenerative disc disease with diffuse bulging annulus is noted mildly impinging on thecal sac with AP diameter in the midline measuring 10.7 mm.  Moderate foraminal narrowing right more than the left is noted at this level due to bulging annulus and osteophyte complex.

At C7-T1 level, no focal disc lesions are seen. Cervical spinal cord shows no focal lesions.
IMPRESSION: 1. No focal bone changes of cervical vertebrae. 

2. At C6-7 level, degenerative disc disease with diffuse bulging annulus is noted mildly impinging on thecal sac with AP diameter in the midline measuring 10.7 mm.  Moderate foraminal narrowing right more than the left is noted at this level due to bulging annulus and osteophyte complex.

3. Findings at other disc levels are described above in detail. 

4. No focal lesions of cervical spinal cord.

## 2023-01-06 ENCOUNTER — Other Ambulatory Visit: Payer: No Typology Code available for payment source | Attending: Internal Medicine | Admitting: Internal Medicine

## 2023-01-06 ENCOUNTER — Ambulatory Visit (INDEPENDENT_AMBULATORY_CARE_PROVIDER_SITE_OTHER): Payer: No Typology Code available for payment source | Admitting: Internal Medicine

## 2023-01-06 ENCOUNTER — Encounter (INDEPENDENT_AMBULATORY_CARE_PROVIDER_SITE_OTHER): Payer: Self-pay | Admitting: Internal Medicine

## 2023-01-06 ENCOUNTER — Telehealth (INDEPENDENT_AMBULATORY_CARE_PROVIDER_SITE_OTHER): Payer: Self-pay | Admitting: Internal Medicine

## 2023-01-06 ENCOUNTER — Other Ambulatory Visit: Payer: Self-pay

## 2023-01-06 VITALS — BP 128/80 | HR 81 | Resp 18 | Ht 67.0 in | Wt 232.0 lb

## 2023-01-06 DIAGNOSIS — E538 Deficiency of other specified B group vitamins: Secondary | ICD-10-CM | POA: Insufficient documentation

## 2023-01-06 DIAGNOSIS — I10 Essential (primary) hypertension: Secondary | ICD-10-CM | POA: Insufficient documentation

## 2023-01-06 DIAGNOSIS — M2550 Pain in unspecified joint: Secondary | ICD-10-CM | POA: Insufficient documentation

## 2023-01-06 DIAGNOSIS — R739 Hyperglycemia, unspecified: Secondary | ICD-10-CM

## 2023-01-06 DIAGNOSIS — K760 Fatty (change of) liver, not elsewhere classified: Secondary | ICD-10-CM

## 2023-01-06 DIAGNOSIS — E782 Mixed hyperlipidemia: Secondary | ICD-10-CM

## 2023-01-06 DIAGNOSIS — E559 Vitamin D deficiency, unspecified: Secondary | ICD-10-CM

## 2023-01-06 DIAGNOSIS — R61 Generalized hyperhidrosis: Secondary | ICD-10-CM | POA: Insufficient documentation

## 2023-01-06 DIAGNOSIS — R5382 Chronic fatigue, unspecified: Secondary | ICD-10-CM

## 2023-01-06 DIAGNOSIS — R7402 Elevation of levels of lactic acid dehydrogenase (LDH): Secondary | ICD-10-CM

## 2023-01-06 DIAGNOSIS — D72829 Elevated white blood cell count, unspecified: Secondary | ICD-10-CM | POA: Insufficient documentation

## 2023-01-06 DIAGNOSIS — M109 Gout, unspecified: Secondary | ICD-10-CM

## 2023-01-06 DIAGNOSIS — M1A09X0 Idiopathic chronic gout, multiple sites, without tophus (tophi): Secondary | ICD-10-CM

## 2023-01-06 DIAGNOSIS — F411 Generalized anxiety disorder: Secondary | ICD-10-CM

## 2023-01-06 DIAGNOSIS — M47812 Spondylosis without myelopathy or radiculopathy, cervical region: Secondary | ICD-10-CM

## 2023-01-06 LAB — GIEMSA STAIN (CBC W/DIFF), WITH PATHOLOGIST REVIEW
BASOPHIL #: 0.1 10*3/uL (ref 0.00–0.10)
BASOPHIL %: 1 % (ref 0–1)
EOSINOPHIL #: 0.4 10*3/uL (ref 0.00–0.50)
EOSINOPHIL %: 4 % (ref 1–8)
HCT: 39.4 % (ref 36.7–47.1)
HGB: 13.4 g/dL (ref 12.5–16.3)
LYMPHOCYTE #: 2.3 10*3/uL (ref 1.00–3.00)
LYMPHOCYTE %: 22 % (ref 16–44)
MCH: 30 pg (ref 23.8–33.4)
MCHC: 34 g/dL (ref 32.5–36.3)
MCV: 88.1 fL (ref 73.0–96.2)
MONOCYTE #: 1.1 10*3/uL — ABNORMAL HIGH (ref 0.30–1.00)
MONOCYTE %: 10 % (ref 5–13)
MPV: 8.1 fL (ref 7.4–11.4)
NEUTROPHIL #: 6.8 10*3/uL (ref 1.85–7.80)
NEUTROPHIL %: 64 % (ref 43–77)
PLATELETS: 466 10*3/uL — ABNORMAL HIGH (ref 140–440)
RBC: 4.48 10*6/uL (ref 4.06–5.63)
RDW: 13.4 % (ref 12.1–16.2)
WBC: 10.7 10*3/uL — ABNORMAL HIGH (ref 3.6–10.2)
WBCS UNCORRECTED: 10.7 10*3/uL

## 2023-01-06 LAB — URINALYSIS, MACROSCOPIC
BILIRUBIN: NEGATIVE mg/dL
BLOOD: NEGATIVE mg/dL
GLUCOSE: NEGATIVE mg/dL
KETONES: NEGATIVE mg/dL
LEUKOCYTES: NEGATIVE WBCs/uL
NITRITE: NEGATIVE
PH: 5.5 (ref 5.0–9.0)
PROTEIN: NEGATIVE mg/dL
SPECIFIC GRAVITY: 1.004 (ref 1.002–1.030)
UROBILINOGEN: NORMAL mg/dL

## 2023-01-06 LAB — SEDIMENTATION RATE: ERYTHROCYTE SEDIMENTATION RATE (ESR): 18 mm/h (ref <20)

## 2023-01-06 LAB — COMPREHENSIVE METABOLIC PANEL, NON-FASTING
ALBUMIN/GLOBULIN RATIO: 1.6 — ABNORMAL HIGH (ref 0.8–1.4)
ALBUMIN: 4.7 g/dL (ref 3.5–5.7)
ALKALINE PHOSPHATASE: 69 U/L (ref 34–104)
ALT (SGPT): 29 U/L (ref 7–52)
ANION GAP: 10 mmol/L (ref 4–13)
AST (SGOT): 24 U/L (ref 13–39)
BILIRUBIN TOTAL: 0.8 mg/dL (ref 0.3–1.0)
BUN/CREA RATIO: 15 (ref 6–22)
BUN: 17 mg/dL (ref 7–25)
CALCIUM, CORRECTED: 9.4 mg/dL (ref 8.9–10.8)
CALCIUM: 10 mg/dL (ref 8.6–10.3)
CHLORIDE: 102 mmol/L (ref 98–107)
CO2 TOTAL: 27 mmol/L (ref 21–31)
CREATININE: 1.16 mg/dL (ref 0.60–1.30)
ESTIMATED GFR: 77 mL/min/{1.73_m2} (ref >59)
GLOBULIN: 3 (ref 2.9–5.4)
GLUCOSE: 75 mg/dL (ref 74–109)
OSMOLALITY, CALCULATED: 278 mosm/kg (ref 270–290)
POTASSIUM: 4 mmol/L (ref 3.5–5.1)
PROTEIN TOTAL: 7.7 g/dL (ref 6.4–8.9)
SODIUM: 139 mmol/L (ref 136–145)

## 2023-01-06 LAB — C-REACTIVE PROTEIN (CRP): C-REACTIVE PROTEIN (CRP): 1.6 mg/dL — ABNORMAL HIGH (ref 0.1–0.5)

## 2023-01-06 LAB — URINALYSIS, MICROSCOPIC
RBCS: 0 /[HPF] (ref <4)
WBCS: 0 /[HPF] (ref <6)

## 2023-01-06 LAB — THYROID STIMULATING HORMONE WITH FREE T4 REFLEX: TSH: 1.742 u[IU]/mL (ref 0.450–5.330)

## 2023-01-06 LAB — LDH: LDH: 185 U/L (ref 140–271)

## 2023-01-06 LAB — PSA, DIAGNOSTIC: PSA: 0.94 ng/mL (ref <=4.00)

## 2023-01-06 LAB — VITAMIN B12: VITAMIN B 12: 254 pg/mL (ref 180–914)

## 2023-01-06 LAB — C-REACTIVE PROTEIN(CRP),INFLAMMATION: C-REACTIVE PROTEIN (CRP): 1.6 mg/dL — ABNORMAL HIGH (ref 0.1–0.5)

## 2023-01-06 MED ORDER — TRAMADOL 50 MG TABLET
1.0000 | ORAL_TABLET | Freq: Four times a day (QID) | ORAL | 0 refills | Status: DC | PRN
Start: 2023-01-06 — End: 2023-03-25

## 2023-01-06 MED ORDER — TRAMADOL 50 MG TABLET
1.0000 | ORAL_TABLET | Freq: Four times a day (QID) | ORAL | 0 refills | Status: DC | PRN
Start: 2023-01-06 — End: 2023-01-06

## 2023-01-06 NOTE — Progress Notes (Addendum)
[facility]NAL [facility], [facility]  [address]  [place] [address]       Name: Greg Booth MRN:  [id number]   Date: 01/06/2023 Age: 52 y.o.       Chief Complaint:    Chief Complaint   Patient presents with    Follow Up     For chronic disease management.        Back Pain        HPI:  Greg Booth is a 52 y.o. male who presents to the office today for follow-up.  Patient is actually doing relatively well and except for minor issues as described below which I have addressed in total with the patient.        Past Medical History:  Past Medical History:   Diagnosis Date    Change in bowel habits     Generalized anxiety disorder     Gout, unspecified     High cholesterol     HTN (hypertension)          History reviewed. No pertinent surgical history.   Current Outpatient Medications   Medication Sig    allopurinoL (ZYLOPRIM) 300 mg Oral Tablet 1 tablet by mouth daily (Patient taking differently: Pt is taking 450 MG daily.)    atorvastatin (LIPITOR) 40 mg Oral Tablet Take 1 Tablet (40 mg total) by mouth Once a day    clindamycin phosphate (CLEOCIN T) 1 % Solution Apply topically Twice daily Apply to area twice daily topically as needed to the back of the scalp    colchicine, gout, 0.6 mg Oral Tablet Take 1 Tablet (0.6 mg total) by mouth Three times a day as needed (Gout Flares) (Patient taking differently: Take 1 Tablet (0.6 mg total) by mouth Once a day)    fenofibrate nanocrystallized (TRICOR) 145 mg Oral Tablet Take 1 Tablet (145 mg total) by mouth Every morning with breakfast    methocarbamoL (ROBAXIN) 750 mg Oral Tablet Take 1 Tablet (750 mg total) by mouth Three times a day Indications: muscle spasm    metoprolol succinate (TOPROL-XL) 25 mg Oral Tablet Sustained Release 24 hr Take 1 Tablet (25 mg total) by mouth Once a day    ramipriL (ALTACE) 10 mg Oral Capsule Take 1 Capsule (10 mg total) by mouth Twice daily     Allergies   Allergen Reactions    Penicillins Rash    Sulfa  (Sulfonamides) Rash       Family History:  Family Medical History:       Problem Relation (Age of Onset)    Diabetes Mother, Father    Gout Mother, Father    Hypertension (High Blood Pressure) Mother, Father    Renal Tumor Father              Social History:   Social History     Tobacco Use   Smoking Status Never    Passive exposure: Never   Smokeless Tobacco Never     Social History     Substance and Sexual Activity   Alcohol Use Yes    Comment: "Occasional"     Social History     Occupational History    Not on file       Review of Systems:  Review of systems as discussed in HPI    Problem List:  Patient Active Problem List   Diagnosis    Gout, unspecified    HTN (hypertension)    Generalized anxiety disorder  Nonalcoholic fatty liver disease without nonalcoholic steatohepatitis (NASH)    Idiopathic chronic gout of multiple sites without tophus    Vitamin D deficiency    Vitamin B12 deficiency (non anemic)    Hyperlipidemia    Annual physical exam    MRSA (methicillin resistant Staphylococcus aureus) carrier    Benign prostatic hyperplasia with nocturia    Elevated blood sugar    Chronic fatigue    Hand pain, right       Physical Examination:  BP 128/80   Pulse 81   Resp 18   Ht 1.702 m (5\' 7" )   Wt 105 kg (232 lb)   SpO2 98%   BMI 36.34 kg/m       Physical Exam  Vitals and nursing note reviewed.   Constitutional:       General: He is not in acute distress.     Appearance: Normal appearance.   HENT:      Head: Normocephalic.      Right Ear: Tympanic membrane normal.      Left Ear: Tympanic membrane normal.      Nose: Nose normal.      Mouth/Throat:      Mouth: Mucous membranes are moist.   Eyes:      General: No scleral icterus.     Extraocular Movements: Extraocular movements intact.      Conjunctiva/sclera: Conjunctivae normal.      Pupils: Pupils are equal, round, and reactive to light.   Neck:      Vascular: No carotid bruit.   Cardiovascular:      Rate and Rhythm: Normal rate and regular rhythm.       Pulses: Normal pulses.           Dorsalis pedis pulses are 2+ on the right side and 2+ on the left side.        Posterior tibial pulses are 2+ on the right side and 2+ on the left side.      Heart sounds: Normal heart sounds.   Pulmonary:      Effort: Pulmonary effort is normal.      Breath sounds: Normal breath sounds. No wheezing, rhonchi or rales.   Abdominal:      General: Abdomen is flat. Bowel sounds are normal.      Tenderness: There is no abdominal tenderness. There is no guarding.   Musculoskeletal:         General: No swelling. Normal range of motion.      Cervical back: Normal range of motion. No tenderness.      Right lower leg: No edema.      Left lower leg: No edema.   Skin:     General: Skin is warm.      Capillary Refill: Capillary refill takes less than 2 seconds.   Neurological:      General: No focal deficit present.      Mental Status: He is alert and oriented to person, place, and time.      Cranial Nerves: No cranial nerve deficit.              Health Maintenance:  Health Maintenance   Topic Date Due    NonMedicare Preventative Exam  01/19/2023 (Originally 11/15/2022)    Influenza Vaccine (1) 02/05/2023 (Originally 11/17/2022)    Colonoscopy  07/07/2023 (Originally 11/11/2017)    Shingles Vaccine (1 of 2) 01/06/2024 (Originally 11/12/2022)    Depression Screening  11/22/2023    Adult Tdap-Td  Discontinued  Hepatitis B Vaccine  Discontinued    Hepatitis C screening  Discontinued    HIV Screening  Discontinued    Covid-19 Vaccine  Discontinued    Prostate Cancer Screening  Discontinued        Assessment:      ICD-10-CM    1. Primary hypertension  I10 Blood Pressure today is stable and will continue current treatment plans     CBC     URINALYSIS, MACROSCOPIC AND MICROSCOPIC W/CULTURE REFLEX     COMPREHENSIVE METABOLIC PANEL, NON-FASTING      2. Mixed hyperlipidemia  E78.2 Labs ordered here and on this visit will be be addressed by me. I will contact patient and address therapy options once completed.        3. Nonalcoholic fatty liver disease without nonalcoholic steatohepatitis (NASH)  K76.0 The patient's condition is unchanged from prior.  We have discussed whether there is need for any therapeutic changes and the patient states that they are tolerating current treatments and will just continue with current lifestyle.       4. Elevated blood sugar  R73.9 Condition stable will continue current therapy.        5. Vitamin B12 deficiency (non anemic)  E53.8 VITAMIN B12      6. Vitamin D deficiency  E55.9 Condition stable will continue current therapy.        7. Idiopathic chronic gout of multiple sites without tophus  [id number].[id number] The patient has had improvements in their symptoms with current treatment plans as described above.  At this point after a lengthy discussion the patient feels that they are stable to a reasonable extent and no further treatments at this point are required.  We will obviously re-evaluate this on their next visit and I have informed them that if there is any worsening in their symptoms to call the office early for further treatment options.       8. Generalized anxiety disorder  F41.1 Condition stable will continue current therapy.        9. Gout of multiple sites, unspecified cause, unspecified chronicity  M10.9 The patient has had improvements in their symptoms with current treatment plans as described above.  At this point after a lengthy discussion the patient feels that they are stable to a reasonable extent and no further treatments at this point are required.  We will obviously re-evaluate this on their next visit and I have informed them that if there is any worsening in their symptoms to call the office early for further treatment options.       10. Chronic fatigue  R53.82 THYROID STIMULATING HORMONE WITH FREE T4 REFLEX     TESTOSTERONE, TOTAL, MS      11. Arthralgia, unspecified joint  M25.50 Labs ordered here and on this visit will be be addressed by me. I will contact patient and  address therapy options once completed.     RHEUMATOID FACTOR, SERUM     HEP-2 SUBSTRATE ANTINUCLEAR ANTIBODIES (ANA), SERUM     CYCLIC CITRULLINATED PEPTIDE ANTIBODIES, IGG, SERUM     SEDIMENTATION RATE     C-REACTIVE PROTEIN (CRP)     SEDIMENTATION RATE     C-REACTIVE PROTEIN(CRP),INFLAMMATION     PSA, DIAGNOSTIC     ADULT ROUTINE BLOOD CULTURE, SET OF 2 BOTTLES (BACTERIA AND YEAST)     HLA-B27, BLOOD     PARVOVIRUS B19 ANTIBODIES, IGG AND IGM, SERUM     EHRLICHIA CHAFFEENSIS (HME) ANTIBODY, IGG, SERUM  LYME ANTIBODY PANEL WITH REFLEX     LDH      12. Night sweats  R61 Labs ordered here and on this visit will be be addressed by me. I will contact patient and address therapy options once completed.     TESTOSTERONE, TOTAL, MS     ADULT ROUTINE BLOOD CULTURE, SET OF 2 BOTTLES (BACTERIA AND YEAST)     PARVOVIRUS B19 ANTIBODIES, IGG AND IGM, SERUM     EHRLICHIA CHAFFEENSIS (HME) ANTIBODY, IGG, SERUM     LYME ANTIBODY PANEL WITH REFLEX     GIEMSA STAIN, WITH PATHOLOGIST REVIEW     LDH      13. Elevated LDH  R74.02 Labs ordered here and on this visit will be be addressed by me. I will contact patient and address therapy options once completed.     LDH             Orders Placed This Encounter    ADULT ROUTINE BLOOD CULTURE, SET OF 2 BOTTLES (BACTERIA AND YEAST)    RHEUMATOID FACTOR, SERUM    HEP-2 SUBSTRATE ANTINUCLEAR ANTIBODIES (ANA), SERUM    CYCLIC CITRULLINATED PEPTIDE ANTIBODIES, IGG, SERUM    SEDIMENTATION RATE    C-REACTIVE PROTEIN (CRP)    SEDIMENTATION RATE    C-REACTIVE PROTEIN(CRP),INFLAMMATION    CBC    VITAMIN B12    URINALYSIS, MACROSCOPIC AND MICROSCOPIC W/CULTURE REFLEX    COMPREHENSIVE METABOLIC PANEL, NON-FASTING    THYROID STIMULATING HORMONE WITH FREE T4 REFLEX    PSA, DIAGNOSTIC    TESTOSTERONE, TOTAL, MS    HLA-B27, BLOOD    PARVOVIRUS B19 ANTIBODIES, IGG AND IGM, SERUM    EHRLICHIA CHAFFEENSIS (HME) ANTIBODY, IGG, SERUM    LYME ANTIBODY PANEL WITH REFLEX    GIEMSA STAIN, WITH PATHOLOGIST REVIEW     LDH     Medications Discontinued During This Encounter   Medication Reason    clonazePAM (KLONOPIN) 0.5 mg Oral Tablet Medication Reconciliation          Current statin:   Active Statin Meds   Medication Sig    atorvastatin (LIPITOR) 40 mg Oral Tablet Take 1 Tablet (40 mg total) by mouth Once a day           Follow up:  Return in about 6 weeks (around 02/17/2023).    This note was partially created using voice recognition software and is inherently subject to errors including those of syntax and "sound alike " substitutions which may escape proof reading.  In such instances, original meaning may be extrapolated by contextual derivation.    Lucia Gaskins, DO  01/06/2023, 12:18

## 2023-01-06 NOTE — Telephone Encounter (Signed)
Spoke with [facility] they will only fill 7 day supply and tramadol and controlled pain prescriptions must have a diagnosis attached to them when ordered

## 2023-01-06 NOTE — Telephone Encounter (Signed)
Kathlene November with [facility] in [place] needs a dx for the tramadol  [phone]

## 2023-01-07 ENCOUNTER — Other Ambulatory Visit (INDEPENDENT_AMBULATORY_CARE_PROVIDER_SITE_OTHER): Payer: Self-pay | Admitting: Internal Medicine

## 2023-01-07 DIAGNOSIS — R61 Generalized hyperhidrosis: Secondary | ICD-10-CM

## 2023-01-07 DIAGNOSIS — D72829 Elevated white blood cell count, unspecified: Secondary | ICD-10-CM

## 2023-01-07 LAB — PATH COMMENT (GIEMSA STAIN): PATHOLOGIST INTERPRETATION: ABNORMAL — AB

## 2023-01-08 LAB — LYME ANTIBODY PANEL WITH REFLEX: LYME ANTIBODY TOTAL (Screen): NEGATIVE

## 2023-01-08 LAB — CYCLIC CITRULLINATED PEPTIDE ANTIBODIES, IGG, SERUM
CYCLIC CITRULLINATED PEPTIDE ANTIBODY IGG QUAL: NEGATIVE
CYCLIC CITRULLINATED PEPTIDE ANTIBODY IGG QUANT: <0.5 U/mL (ref <3.0)

## 2023-01-08 LAB — EBV ANTIBODY PROFILE
EBNA ANTIBODY, QUALITATIVE: POSITIVE — AB
EBV VCA IGG ANTIBODY, QUALITATIVE: POSITIVE — AB
EBV VCA IGM ANTIBODY, QUALITATIVE: NEGATIVE

## 2023-01-08 LAB — RHEUMATOID FACTOR, SERUM: RHEUMATOID FACTOR: <13 [IU]/mL (ref <=30)

## 2023-01-08 LAB — HEP-2 SUBSTRATE ANTINUCLEAR ANTIBODIES (ANA), SERUM: ANA INTERPRETATION: NEGATIVE

## 2023-01-09 LAB — EHRLICHIA CHAFFEENSIS (HME) ANTIBODY, IGG, SERUM: EHRLICHIA CHAFFEENSIS IGG: <1:64 {titer}

## 2023-01-10 LAB — TESTOSTERONE, TOTAL, MS: TESTOSTERONE,TOTAL,LC/MS/MS: 299 ng/dL (ref 250–1100)

## 2023-01-13 ENCOUNTER — Other Ambulatory Visit (INDEPENDENT_AMBULATORY_CARE_PROVIDER_SITE_OTHER): Payer: Self-pay | Admitting: Internal Medicine

## 2023-01-13 ENCOUNTER — Telehealth (INDEPENDENT_AMBULATORY_CARE_PROVIDER_SITE_OTHER): Payer: Self-pay | Admitting: Internal Medicine

## 2023-01-13 DIAGNOSIS — M1A09X0 Idiopathic chronic gout, multiple sites, without tophus (tophi): Secondary | ICD-10-CM

## 2023-01-13 DIAGNOSIS — M79641 Pain in right hand: Secondary | ICD-10-CM

## 2023-01-13 DIAGNOSIS — M2550 Pain in unspecified joint: Secondary | ICD-10-CM

## 2023-01-13 DIAGNOSIS — Z20828 Contact with and (suspected) exposure to other viral communicable diseases: Secondary | ICD-10-CM

## 2023-01-13 LAB — PARVOVIRUS B19 ANTIBODIES, IGG AND IGM, SERUM
PARVOVIRUS B19 AB IGG: 3.5 — ABNORMAL HIGH (ref <0.9)
PARVOVIRUS B19 AB IGM: 0.1 (ref <0.9)

## 2023-01-13 NOTE — Telephone Encounter (Signed)
Referral has already been sent for appt to [facility]

## 2023-01-13 NOTE — Telephone Encounter (Signed)
[facility] IN [place] CALLED TO MAKE PT AN APPT, BUT PT WOULD PREFER TO GO TO [facility]

## 2023-01-17 ENCOUNTER — Other Ambulatory Visit (INDEPENDENT_AMBULATORY_CARE_PROVIDER_SITE_OTHER): Payer: Self-pay | Admitting: Internal Medicine

## 2023-01-17 ENCOUNTER — Encounter (INDEPENDENT_AMBULATORY_CARE_PROVIDER_SITE_OTHER): Payer: Self-pay | Admitting: Internal Medicine

## 2023-01-17 MED ORDER — PREDNISONE 10 MG TABLET
ORAL_TABLET | ORAL | 0 refills | Status: DC
Start: 2023-01-17 — End: 2023-02-20

## 2023-01-30 ENCOUNTER — Other Ambulatory Visit (INDEPENDENT_AMBULATORY_CARE_PROVIDER_SITE_OTHER): Payer: Self-pay | Admitting: Internal Medicine

## 2023-01-30 DIAGNOSIS — Z20828 Contact with and (suspected) exposure to other viral communicable diseases: Secondary | ICD-10-CM

## 2023-02-20 ENCOUNTER — Encounter (INDEPENDENT_AMBULATORY_CARE_PROVIDER_SITE_OTHER): Payer: Self-pay | Admitting: Internal Medicine

## 2023-02-20 ENCOUNTER — Other Ambulatory Visit: Payer: Self-pay

## 2023-02-20 ENCOUNTER — Telehealth: Payer: No Typology Code available for payment source | Admitting: Internal Medicine

## 2023-02-20 DIAGNOSIS — M546 Pain in thoracic spine: Secondary | ICD-10-CM

## 2023-02-20 DIAGNOSIS — E782 Mixed hyperlipidemia: Secondary | ICD-10-CM

## 2023-02-20 DIAGNOSIS — I10 Essential (primary) hypertension: Secondary | ICD-10-CM

## 2023-02-20 DIAGNOSIS — M545 Low back pain, unspecified: Secondary | ICD-10-CM

## 2023-02-20 DIAGNOSIS — G8929 Other chronic pain: Secondary | ICD-10-CM

## 2023-02-20 DIAGNOSIS — R739 Hyperglycemia, unspecified: Secondary | ICD-10-CM

## 2023-02-20 DIAGNOSIS — M47812 Spondylosis without myelopathy or radiculopathy, cervical region: Secondary | ICD-10-CM

## 2023-02-20 DIAGNOSIS — U071 COVID-19: Secondary | ICD-10-CM

## 2023-02-20 DIAGNOSIS — M1A09X0 Idiopathic chronic gout, multiple sites, without tophus (tophi): Secondary | ICD-10-CM

## 2023-02-20 MED ORDER — METHYLPREDNISOLONE 4 MG TABLETS IN A DOSE PACK
ORAL_TABLET | ORAL | 0 refills | Status: DC
Start: 2023-02-20 — End: 2023-08-25

## 2023-02-20 MED ORDER — AZITHROMYCIN 250 MG TABLET
ORAL_TABLET | ORAL | 0 refills | Status: DC
Start: 2023-02-20 — End: 2023-08-25

## 2023-02-20 NOTE — Progress Notes (Signed)
[facility], [facility]  [address]  [place] [address] like like that    Telephone Visit       Date of Service: 02/20/2023 10:45 AM EST    TELEPHONE ENCOUNTER  The patient initiated a request for telephone and is established with this practice. I have discussed the risks, benefits, and limitations of receiving care virtually with the patient. The patient expressed understanding and is willing to move forward. Verbal consent for this service was obtained from the patient/family.    Last office visit in this department: 01/06/2023    Chief Complaint:     Chief Complaint   Patient presents with    Covid-19 Positive     Tested positive for Covid on Tuesday, with nasal congestion, low fever, some headaches.  Has been taking OTC medication for this. Said the back pain was good while on the prednisone, said now the pain comes and goes.  Said he did hold the Lipitor and TriCor and can not see any difference in the back pain. Questions if he should go back on them.        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 No past surgical  history pertinent negatives.        Family Medical History:       Problem Relation (Age of Onset)    Diabetes Mother, Father    Gout Mother, Father    Hypertension (High Blood Pressure) Mother, Father    Renal Tumor Father              Social History     Socioeconomic History    Marital status: Married     Spouse name: Not on file    Number of children: Not on file    Years of education: Not on file    Highest education level: Not on file   Occupational History    Not on file   Tobacco Use    Smoking status: Never     Passive exposure: Never    Smokeless tobacco: Never   Vaping Use    Vaping status: Never Used   Substance and Sexual Activity    Alcohol use: Yes     Comment: "Occasional"    Drug use: Never    Sexual activity: Not Currently   Other Topics Concern    Not on file   Social History Narrative    Not on file     Social Determinants of Health     Financial  Resource Strain: Low Risk  (01/06/2023)    Financial Resource Strain     SDOH Financial: No   Transportation Needs: Low Risk  (01/06/2023)    Transportation Needs     SDOH Transportation: No   Social Connections: Low Risk  (01/06/2023)    Social Connections     SDOH Social Isolation: 5 or more times a week   Intimate Partner Violence: Low Risk  (01/06/2023)    Intimate Partner Violence     SDOH Domestic Violence: No   Housing Stability: Low Risk  (01/06/2023)    Housing Stability     SDOH Housing Situation: I have housing.     SDOH Housing Worry: No       Current Outpatient Medications   Medication Sig    allopurinoL (ZYLOPRIM) 300 mg Oral Tablet 1 tablet by mouth daily (Patient taking differently: Pt is taking 450 MG daily.)    atorvastatin (LIPITOR) 40 mg Oral Tablet Take 1 Tablet (40 mg total) by mouth Once a day    azithromycin (ZITHROMAX) 250 mg Oral Tablet Take 500 mg (2 tab) on day 1; take 250 mg (1 tab) on days 2-5.    clindamycin phosphate (CLEOCIN T) 1 % Solution Apply topically Twice daily Apply to area twice daily topically as needed to the  back of the scalp    colchicine, gout, 0.6 mg Oral Tablet Take 1 Tablet (0.6 mg total) by mouth Three times a day as needed (Gout Flares) (Patient taking differently: Take 1 Tablet (0.6 mg total) by mouth Once a day)    fenofibrate nanocrystallized (TRICOR) 145 mg Oral Tablet Take 1 Tablet (145 mg total) by mouth Every morning with breakfast    methocarbamoL (ROBAXIN) 750 mg Oral Tablet Take 1 Tablet (750 mg total) by mouth Three times a day Indications: muscle spasm    Methylprednisolone (MEDROL DOSEPACK) 4 mg Oral Tablets, Dose Pack Take as instructed.    metoprolol succinate (TOPROL-XL) 25 mg Oral Tablet Sustained Release 24 hr Take 1 Tablet (25 mg total) by mouth Once a day    predniSONE (DELTASONE) 10 mg Oral Tablet Prednisone 10 mg TID for 5 days, BID for 5 days, daily for 5 days 1/2 daily for 5 days then stop    ramipriL (ALTACE) 10 mg Oral Capsule Take 1 Capsule (10 mg total) by mouth Twice daily    traMADoL (ULTRAM) 50 mg Oral Tablet Take 1 Tablet (50 mg total) by mouth Every 6 hours as needed for Pain       Call Notes:     Review of Systems  Pertinent items are noted in HPI.    Call notes:  Patient has a diagnosis of COVID he tested positive several days ago and he states that he is starting to feel better however he is having increasing amounts of cough and congestion.  He is having no hemoptysis he is having no nausea or vomiting no diarrhea no constipation hematochezia or melena.  He is otherwise stating that he is slowly starting to feel better just a lot more fatigue.  We had a lengthy discussion also in regards to his chronic back pain that he has been having in his thoracic and lumbar region.  Had an extensive workup that has been for inflammatory arthritis as and I am going to order an HLA B27.  Unfortunately his parvo B19 did come back to be positive but it shows a exposure and not an acute infection.  I have told him that he  really may want to consider Rheumatology evaluation of choice.  He is  concerned about having ankylosing spondylitis however I have reviewed his x-rays consisting of his thoracic spine and there does not appear to be any suggestion of bridging effects that would suggest a form of DISH or ankylosing spondylitis at this point.      Visit Diagnosis        ICD-10-CM    1. COVID-19 virus infection  U07.1 Condition stable will continue current therapy.      azithromycin (ZITHROMAX) 250 mg Oral Tablet     Methylprednisolone (MEDROL DOSEPACK) 4 mg Oral Tablets, Dose Pack      2. Primary hypertension  I10       3. Mixed hyperlipidemia  E78.2       4. Elevated blood sugar  R73.9       5. Idiopathic chronic gout of multiple sites without tophus  M1A.09X0       6. Chronic bilateral thoracic back pain  M54.6 HLA-B27, BLOOD    G89.29       7. Degenerative joint disease of cervical spine  M47.812             Follow Up     Next scheduled appointment    Telemedicine Documentation:       Patient Location:  My telephone visit from home address: [address] [place] [address]    Patient/family aware of provider location:  yes  Patient/family consent for telemedicine:  yes  Examination observed and performed by:  Lucia Gaskins, DO    I personally offered the service to the patient, and obtained verbal consent to provide this service.    Lucia Gaskins, DO        Time Spent     Total provider time spent with the patient on the phone: 14 mins.      Lucia Gaskins, DO  02/20/2023 10:48   This note was partially generated using MModal Fluency Direct system, and there may be some incorrect words, spellings, and punctuation that were not noted in checking the note before saving

## 2023-03-13 ENCOUNTER — Encounter (INDEPENDENT_AMBULATORY_CARE_PROVIDER_SITE_OTHER): Payer: Self-pay | Admitting: Internal Medicine

## 2023-03-13 NOTE — Telephone Encounter (Signed)
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  The fact that his labs however have not been confirmatory means that he may have a seronegative arthritis and because of this really needs to see Rheumatology of choice.  He could see local with Dr.Ahmed, Dr. Rema Jasmine or go to a larger institution.  In the meantime I would order an HLA B27 and start him on Celebrex 200 mg 1 p.o. twice a day as needed for pain and for him to intermittently use Tylenol 325 mg 2 tablets as needed for pain not relieved by Celebrex.  I would also recommend a taper of prednisone 10 mg 1 tablet 3 times a day for 4 days 1 tablet twice a day for 4 days 1 tablet daily for 4 days half tablet daily for 4 days and discontinue.

## 2023-03-18 ENCOUNTER — Other Ambulatory Visit: Payer: Self-pay

## 2023-03-22 ENCOUNTER — Other Ambulatory Visit (INDEPENDENT_AMBULATORY_CARE_PROVIDER_SITE_OTHER): Payer: Self-pay | Admitting: Internal Medicine

## 2023-03-22 DIAGNOSIS — M47812 Spondylosis without myelopathy or radiculopathy, cervical region: Secondary | ICD-10-CM

## 2023-03-25 ENCOUNTER — Telehealth (INDEPENDENT_AMBULATORY_CARE_PROVIDER_SITE_OTHER): Payer: Self-pay | Admitting: Internal Medicine

## 2023-03-25 ENCOUNTER — Other Ambulatory Visit (INDEPENDENT_AMBULATORY_CARE_PROVIDER_SITE_OTHER): Payer: Self-pay | Admitting: Internal Medicine

## 2023-03-25 NOTE — Telephone Encounter (Signed)
Spoke with pharmacy diagnosis cervical spondylosis [id number],lpn

## 2023-03-25 NOTE — Telephone Encounter (Signed)
[facility] IN  [facility] TO TRAMADOL SCRIPT #90 NEED UPDATED DX  [id number]

## 2023-04-13 ENCOUNTER — Other Ambulatory Visit (INDEPENDENT_AMBULATORY_CARE_PROVIDER_SITE_OTHER): Payer: Self-pay | Admitting: Internal Medicine

## 2023-04-13 DIAGNOSIS — I10 Essential (primary) hypertension: Secondary | ICD-10-CM

## 2023-05-23 ENCOUNTER — Other Ambulatory Visit (INDEPENDENT_AMBULATORY_CARE_PROVIDER_SITE_OTHER): Payer: Self-pay | Admitting: Internal Medicine

## 2023-05-23 ENCOUNTER — Other Ambulatory Visit (INDEPENDENT_AMBULATORY_CARE_PROVIDER_SITE_OTHER): Payer: Self-pay | Admitting: NURSE PRACTITIONER

## 2023-05-23 DIAGNOSIS — E782 Mixed hyperlipidemia: Secondary | ICD-10-CM

## 2023-05-23 DIAGNOSIS — E7800 Pure hypercholesterolemia, unspecified: Secondary | ICD-10-CM

## 2023-05-23 DIAGNOSIS — I10 Essential (primary) hypertension: Secondary | ICD-10-CM

## 2023-07-19 ENCOUNTER — Other Ambulatory Visit (INDEPENDENT_AMBULATORY_CARE_PROVIDER_SITE_OTHER): Payer: Self-pay | Admitting: Internal Medicine

## 2023-07-19 DIAGNOSIS — I10 Essential (primary) hypertension: Secondary | ICD-10-CM

## 2023-07-23 ENCOUNTER — Other Ambulatory Visit (INDEPENDENT_AMBULATORY_CARE_PROVIDER_SITE_OTHER): Payer: Self-pay | Admitting: Internal Medicine

## 2023-07-23 DIAGNOSIS — I10 Essential (primary) hypertension: Secondary | ICD-10-CM

## 2023-08-18 ENCOUNTER — Ambulatory Visit: Attending: Internal Medicine | Admitting: Internal Medicine

## 2023-08-18 ENCOUNTER — Other Ambulatory Visit: Payer: Self-pay

## 2023-08-18 ENCOUNTER — Encounter (INDEPENDENT_AMBULATORY_CARE_PROVIDER_SITE_OTHER): Payer: Self-pay | Admitting: Internal Medicine

## 2023-08-18 VITALS — BP 129/71 | HR 74 | Ht 67.0 in | Wt 209.0 lb

## 2023-08-18 DIAGNOSIS — R351 Nocturia: Secondary | ICD-10-CM | POA: Insufficient documentation

## 2023-08-18 DIAGNOSIS — E782 Mixed hyperlipidemia: Secondary | ICD-10-CM | POA: Insufficient documentation

## 2023-08-18 DIAGNOSIS — M109 Gout, unspecified: Secondary | ICD-10-CM | POA: Insufficient documentation

## 2023-08-18 DIAGNOSIS — R739 Hyperglycemia, unspecified: Secondary | ICD-10-CM | POA: Insufficient documentation

## 2023-08-18 DIAGNOSIS — E785 Hyperlipidemia, unspecified: Secondary | ICD-10-CM | POA: Insufficient documentation

## 2023-08-18 DIAGNOSIS — N401 Enlarged prostate with lower urinary tract symptoms: Secondary | ICD-10-CM | POA: Insufficient documentation

## 2023-08-18 DIAGNOSIS — M79641 Pain in right hand: Secondary | ICD-10-CM | POA: Insufficient documentation

## 2023-08-18 DIAGNOSIS — E7800 Pure hypercholesterolemia, unspecified: Secondary | ICD-10-CM | POA: Insufficient documentation

## 2023-08-18 DIAGNOSIS — Z8051 Family history of malignant neoplasm of kidney: Secondary | ICD-10-CM | POA: Insufficient documentation

## 2023-08-18 DIAGNOSIS — M2559 Pain in other specified joint: Secondary | ICD-10-CM | POA: Insufficient documentation

## 2023-08-18 DIAGNOSIS — I10 Essential (primary) hypertension: Secondary | ICD-10-CM | POA: Insufficient documentation

## 2023-08-18 DIAGNOSIS — E538 Deficiency of other specified B group vitamins: Secondary | ICD-10-CM | POA: Insufficient documentation

## 2023-08-18 DIAGNOSIS — Z1211 Encounter for screening for malignant neoplasm of colon: Secondary | ICD-10-CM | POA: Insufficient documentation

## 2023-08-18 DIAGNOSIS — D649 Anemia, unspecified: Secondary | ICD-10-CM | POA: Insufficient documentation

## 2023-08-18 DIAGNOSIS — Z0000 Encounter for general adult medical examination without abnormal findings: Secondary | ICD-10-CM

## 2023-08-18 LAB — COMPREHENSIVE METABOLIC PANEL, NON-FASTING
ALBUMIN/GLOBULIN RATIO: 1.7 — ABNORMAL HIGH (ref 0.8–1.4)
ALBUMIN: 4.7 g/dL (ref 3.5–5.7)
ALKALINE PHOSPHATASE: 59 U/L (ref 34–104)
ALT (SGPT): 12 U/L (ref 7–52)
ANION GAP: 8 mmol/L (ref 4–13)
AST (SGOT): 16 U/L (ref 13–39)
BILIRUBIN TOTAL: 1 mg/dL (ref 0.3–1.0)
BUN/CREA RATIO: 15 (ref 6–22)
BUN: 18 mg/dL (ref 7–25)
CALCIUM, CORRECTED: 9.7 mg/dL (ref 8.9–10.8)
CALCIUM: 10.3 mg/dL (ref 8.6–10.3)
CHLORIDE: 102 mmol/L (ref 98–107)
CO2 TOTAL: 28 mmol/L (ref 21–31)
CREATININE: 1.2 mg/dL (ref 0.60–1.30)
ESTIMATED GFR: 74 mL/min/{1.73_m2} (ref >59)
GLOBULIN: 2.8 (ref 2.0–3.5)
GLUCOSE: 75 mg/dL (ref 74–109)
OSMOLALITY, CALCULATED: 276 mosm/kg (ref 270–290)
POTASSIUM: 4 mmol/L (ref 3.5–5.1)
PROTEIN TOTAL: 7.5 g/dL (ref 6.4–8.9)
SODIUM: 138 mmol/L (ref 136–145)

## 2023-08-18 LAB — IRON TRANSFERRIN AND TIBC
IRON (TRANSFERRIN) SATURATION: 11 % — ABNORMAL LOW (ref 20–50)
IRON: 41 ug/dL — ABNORMAL LOW (ref 50–212)
TOTAL IRON BINDING CAPACITY: 385 ug/dL (ref 250–450)
TRANSFERRIN: 275 mg/dL (ref 203–362)
UIBC: 344 ug/dL (ref 130–375)

## 2023-08-18 LAB — URINALYSIS, MACROSCOPIC
BILIRUBIN: NEGATIVE mg/dL
BLOOD: NEGATIVE mg/dL
GLUCOSE: NEGATIVE mg/dL
KETONES: NEGATIVE mg/dL
LEUKOCYTES: NEGATIVE WBCs/uL
NITRITE: NEGATIVE
PH: 5.5 (ref 5.0–9.0)
PROTEIN: NEGATIVE mg/dL
SPECIFIC GRAVITY: 1.006 (ref 1.002–1.030)
UROBILINOGEN: NORMAL mg/dL

## 2023-08-18 LAB — CBC
HCT: 42.6 % (ref 36.7–47.1)
HGB: 13.8 g/dL (ref 12.5–16.3)
MCH: 27.2 pg (ref 23.8–33.4)
MCHC: 32.4 g/dL — ABNORMAL LOW (ref 32.5–36.3)
MCV: 84 fL (ref 73.0–96.2)
MPV: 8.1 fL (ref 7.4–11.4)
PLATELETS: 431 10*3/uL (ref 140–440)
RBC: 5.07 10*6/uL (ref 4.06–5.63)
RDW: 15.3 % (ref 12.1–16.2)
WBC: 9.5 10*3/uL (ref 3.6–10.2)

## 2023-08-18 LAB — URIC ACID: URIC ACID: 4.6 mg/dL (ref 2.3–7.6)

## 2023-08-18 LAB — PSA, DIAGNOSTIC: PSA: 1.66 ng/mL (ref <=4.00)

## 2023-08-18 LAB — HGA1C (HEMOGLOBIN A1C WITH EST AVG GLUCOSE): HEMOGLOBIN A1C: 5.5 % (ref 4.0–6.0)

## 2023-08-18 LAB — RETICULOCYTE COUNT
IMMATURE RETIC FRACTION: 0.3 (ref 0.3–0.54)
RETICULOCYTE % AUTOMATED: 1.58 % (ref 0.42–2.23)
RETICULOCYTES COUNT # AUTOMATED: 0.08 10*6/uL (ref 0.0188–0.1086)

## 2023-08-18 LAB — LIPID PANEL
CHOL/HDL RATIO: 4.4
CHOLESTEROL: 167 mg/dL (ref <200)
HDL CHOL: 38 mg/dL — ABNORMAL LOW (ref >=40)
LDL CALC: 105 mg/dL — ABNORMAL HIGH (ref 0–100)
TRIGLYCERIDES: 121 mg/dL (ref <=150)
VLDL CALC: 24 mg/dL (ref 0–50)

## 2023-08-18 LAB — FERRITIN: FERRITIN: 354 ng/mL — ABNORMAL HIGH (ref 24–336)

## 2023-08-18 LAB — URINALYSIS, MICROSCOPIC: RBCS: <1 /HPF (ref <4)

## 2023-08-18 LAB — THYROID STIMULATING HORMONE WITH FREE T4 REFLEX: TSH: 1.81 u[IU]/mL (ref 0.450–5.330)

## 2023-08-18 LAB — SEDIMENTATION RATE: ERYTHROCYTE SEDIMENTATION RATE (ESR): 24 mm/h — ABNORMAL HIGH (ref <20)

## 2023-08-18 LAB — C-REACTIVE PROTEIN (CRP): C-REACTIVE PROTEIN (CRP): 2.4 mg/dL — ABNORMAL HIGH (ref 0.1–0.5)

## 2023-08-18 LAB — VITAMIN B12: VITAMIN B 12: 185 pg/mL (ref 180–914)

## 2023-08-18 MED ORDER — METOPROLOL SUCCINATE ER 25 MG TABLET,EXTENDED RELEASE 24 HR
25.0000 mg | ORAL_TABLET | Freq: Every day | ORAL | 0 refills | Status: DC
Start: 2023-08-18 — End: 2024-02-09

## 2023-08-18 MED ORDER — ATORVASTATIN 40 MG TABLET
40.0000 mg | ORAL_TABLET | Freq: Every day | ORAL | 1 refills | Status: AC
Start: 2023-08-18 — End: ?

## 2023-08-18 MED ORDER — FENOFIBRATE NANOCRYSTALLIZED 145 MG TABLET
145.0000 mg | ORAL_TABLET | Freq: Every morning | ORAL | 0 refills | Status: DC
Start: 2023-08-18 — End: 2023-12-09

## 2023-08-18 MED ORDER — RAMIPRIL 10 MG CAPSULE
10.0000 mg | ORAL_CAPSULE | Freq: Two times a day (BID) | ORAL | 0 refills | Status: DC
Start: 2023-08-18 — End: 2023-11-21

## 2023-08-18 MED ORDER — COLCHICINE 0.6 MG TABLET
0.6000 mg | ORAL_TABLET | Freq: Three times a day (TID) | ORAL | 3 refills | Status: AC | PRN
Start: 2023-08-18 — End: ?

## 2023-08-18 NOTE — Progress Notes (Addendum)
 [facility], [facility]  [address]  [place] [address]  Operated by [facility]     Name: Greg Booth MRN:  [id number]   Date: 08/18/2023 Age: 53 y.o.       Chief Complaint:    Chief Complaint   Patient presents with    Medicare Annual    Medication Advice     Needs to discuss the Tramadol , needs a colonoscopy. Also is fasting for labs and needs a liver function test. Needs refills.         HPI:  Greg Booth is a 53 y.o. male who presents to the office today for follow-up.     History of Present Illness    The patient presents to the clinic today for follow-up regarding chronic joint pain. They report that their joint problems primarily affect their spine, with pain radiating from the cervical region down through the thoracic and lumbar areas. The patient underwent x-rays which showed degenerative joint disease. They have seen a rheumatologist and a spine specialist, but felt dissatisfied with the lack of definitive treatment options offered.   The patient describes the onset of their symptoms as relatively sudden, occurring about a year ago in August. They recall an initial episode where they could not lift their head off the pillow and experienced night sweats. Since then, they have had ongoing problems with joint pain and stiffness, particularly in the mornings. The patient reports that some days are worse than others, with certain weekends being particularly difficult.   The patient notes that while movement and exercise help alleviate the pain, it still hurts to move. They describe a pattern where they feel fine during and after exercise, but experience stiffness again after sitting for prolonged periods. The patient has been trying to lose weight and increase their activity level, which they believe has been helpful. They also mention starting testosterone  replacement therapy in March, which they feel has been beneficial.   The patient reports that  prednisone  provides significant relief, suggesting an inflammatory component to their pain. They express frustration with the lack of a definitive diagnosis, noting that their blood work has been "great" despite their symptoms. The patient also mentions a history of gout, which is currently well-controlled by their rheumatologist.   In addition to the joint pain, the patient reports difficulty sleeping, getting only about 6 hours of sleep per night. They express more concern about the sleep issue than the pain itself.   The patient has a family history of kidney cancer, with both their grandfather and father having undergone nephrectomies in their fifties. While they do not report any current kidney problems, they express interest in proactively monitoring their kidney function.             Past Medical History:  Past Medical History:   Diagnosis Date    Change in bowel habits     Generalized anxiety disorder     Gout, unspecified     High cholesterol     HTN (hypertension)          History reviewed. No pertinent surgical history.   Current Outpatient Medications   Medication Sig    allopurinoL  (ZYLOPRIM ) 300 mg Oral Tablet 1 tablet by mouth daily (Patient taking differently: Take 1.5 Tablets (450 mg total) by mouth Daily 1 tablet by mouth daily)    atorvastatin  (LIPITOR) 40 mg Oral Tablet Take 1 Tablet (40 mg total) by mouth Daily    azithromycin  (ZITHROMAX )  250 mg Oral Tablet Take 500 mg (2 tab) on day 1; take 250 mg (1 tab) on days 2-5. (Patient not taking: Reported on 08/18/2023)    clindamycin  phosphate (CLEOCIN  T) 1 % Solution Apply topically Twice daily Apply to area twice daily topically as needed to the back of the scalp    colchicine  0.6 mg Oral Tablet Take 1 Tablet (0.6 mg total) by mouth Three times a day as needed (Gout Flares)    fenofibrate  nanocrystallized (TRICOR ) 145 mg Oral Tablet Take 1 Tablet (145 mg total) by mouth Every morning before breakfast    methocarbamoL  (ROBAXIN ) 750 mg Oral Tablet Take  1 Tablet (750 mg total) by mouth Three times a day Indications: muscle spasm    Methylprednisolone  (MEDROL  DOSEPACK) 4 mg Oral Tablets, Dose Pack Take as instructed. (Patient not taking: Reported on 08/18/2023)    metoprolol  succinate (TOPROL -XL) 25 mg Oral Tablet Sustained Release 24 hr Take 1 Tablet (25 mg total) by mouth Daily    ramipriL  (ALTACE ) 10 mg Oral Capsule Take 1 Capsule (10 mg total) by mouth Twice daily    testosterone  cypionate (DEPO-TESTOSTERONE ) 100 mg/mL IntraMUSCULAR Oil Inject 100 mg/m2 into the muscle Every 7 days    traMADoL  (ULTRAM ) 50 mg Oral Tablet TAKE 1 TABLET BY MOUTH EVERY 6 HOURS AS NEEDED FOR PAIN     Allergies   Allergen Reactions    Penicillins Rash    Sulfa (Sulfonamides) Rash       Family History:  Family Medical History:       Problem Relation (Age of Onset)    Diabetes Mother, Father    Gout Mother, Father    Hypertension (High Blood Pressure) Mother, Father    Renal Tumor Father              Social History:   Social History     Tobacco Use   Smoking Status Never    Passive exposure: Never   Smokeless Tobacco Never     Social History     Substance and Sexual Activity   Alcohol Use Yes    Comment: "Occasional"     Social History     Occupational History    Not on file       Review of Systems:  Review of systems as discussed in HPI    Problem List:  Patient Active Problem List   Diagnosis    Gout, unspecified    HTN (hypertension)    Generalized anxiety disorder    Nonalcoholic fatty liver disease without nonalcoholic steatohepatitis (NASH)    Idiopathic chronic gout of multiple sites without tophus    Vitamin D deficiency    Vitamin B12 deficiency (non anemic)    Hyperlipidemia    Annual physical exam    MRSA (methicillin resistant Staphylococcus aureus) carrier    Benign prostatic hyperplasia with nocturia    Elevated blood sugar    Chronic fatigue    Hand pain, right       Physical Examination:  BP 129/71 (Site: Right Arm, Patient Position: Sitting, Cuff Size: Adult Large)    Pulse 74   Ht 1.702 m (5\' 7" )   Wt 94.8 kg (209 lb)   SpO2 99%   BMI 32.73 kg/m       Physical Exam  Vitals and nursing note reviewed.   Constitutional:       General: He is not in acute distress.     Appearance: Normal appearance.   HENT:  Head: Normocephalic.      Right Ear: Tympanic membrane normal.      Left Ear: Tympanic membrane normal.      Nose: Nose normal.      Mouth/Throat:      Mouth: Mucous membranes are moist.   Eyes:      General: No scleral icterus.     Extraocular Movements: Extraocular movements intact.      Conjunctiva/sclera: Conjunctivae normal.      Pupils: Pupils are equal, round, and reactive to light.   Neck:      Vascular: No carotid bruit.   Cardiovascular:      Rate and Rhythm: Normal rate and regular rhythm.      Pulses: Normal pulses.           Dorsalis pedis pulses are 2+ on the right side and 2+ on the left side.        Posterior tibial pulses are 2+ on the right side and 2+ on the left side.      Heart sounds: Normal heart sounds.   Pulmonary:      Effort: Pulmonary effort is normal.      Breath sounds: Normal breath sounds. No wheezing, rhonchi or rales.   Abdominal:      General: Abdomen is flat. Bowel sounds are normal.      Tenderness: There is no abdominal tenderness. There is no guarding.   Musculoskeletal:         General: No swelling. Normal range of motion.      Cervical back: Normal range of motion. No tenderness.      Right lower leg: No edema.      Left lower leg: No edema.   Skin:     General: Skin is warm.      Capillary Refill: Capillary refill takes less than 2 seconds.   Neurological:      General: No focal deficit present.      Mental Status: He is alert and oriented to person, place, and time.      Cranial Nerves: No cranial nerve deficit.              Health Maintenance:  Health Maintenance   Topic Date Due    Colonoscopy  Never done    NonMedicare Preventative Exam  08/31/2023 (Originally 11/15/2022)    Shingles Vaccine (1 of 2) 01/06/2024 (Originally  11/12/2022)    Pneumococcal Vaccination, Age 15+ (1 of 1 - PCV) 08/17/2024 (Originally 11/12/2022)    Influenza Vaccine (Season Ended) 11/17/2023    Depression Screening  11/22/2023    Adult Tdap-Td  Discontinued    Hepatitis B Vaccine  Discontinued    Hepatitis C screening  Discontinued    HIV Screening  Discontinued    Covid-19 Vaccine  Discontinued    Prostate Cancer Screening  Discontinued        Results    Cervical spine x-ray shows degenerative changes.  Thoracic spine x-ray shows degenerative changes.  Lumbar spine x-ray shows degenerative changes.  Lyme disease serologies were negative in October.  CRP was elevated at 1.6 in October.  Rheumatoid factor was negative, ruling out rheumatoid arthritis, lupus, and mixed connective tissue disease.  Erythrocyte sedimentation rate was mildly elevated in October.        Assessment:    1a. Medicare Well Exam  Z00.00       Exam Performed    ICD-10-CM    1. Family history of renal cell carcinoma  Z80.51 US  KIDNEY      2. Primary hypertension  I10 ramipriL  (ALTACE ) 10 mg Oral Capsule     metoprolol  succinate (TOPROL -XL) 25 mg Oral Tablet Sustained Release 24 hr     CBC      3. Hyperlipidemia, unspecified hyperlipidemia type  E78.5 atorvastatin  (LIPITOR) 40 mg Oral Tablet      4. Hand pain, right  M79.641 atorvastatin  (LIPITOR) 40 mg Oral Tablet      5. High cholesterol  E78.00 fenofibrate  nanocrystallized (TRICOR ) 145 mg Oral Tablet     LIPID PANEL      6. Mixed hyperlipidemia  E78.2 fenofibrate  nanocrystallized (TRICOR ) 145 mg Oral Tablet     LIPID PANEL      7. Gout of multiple sites, unspecified cause, unspecified chronicity  M10.9 colchicine  0.6 mg Oral Tablet     URIC ACID      8. Pain in other joint  M25.59 RHEUMATOID FACTOR, SERUM     HEP-2 SUBSTRATE ANTINUCLEAR ANTIBODIES (ANA), SERUM     CYCLIC CITRULLINATED PEPTIDE ANTIBODIES, IGG, SERUM     SEDIMENTATION RATE     C-REACTIVE PROTEIN (CRP)     LYME ANTIBODY PANEL WITH REFLEX      PSA, DIAGNOSTIC     URINALYSIS, MACROSCOPIC AND MICROSCOPIC W/CULTURE REFLEX     COMPREHENSIVE METABOLIC PANEL, NON-FASTING     THYROID  STIMULATING HORMONE WITH FREE T4 REFLEX     HLA-B27, BLOOD      9. Anemia, unspecified type  D64.9 IRON     IRON TRANSFERRIN AND TIBC     FERRITIN     IRON TRANSFERRIN AND TIBC     RETICULOCYTE COUNT     PROTEIN ELECTROPHORESIS, SERUM (SPEP)     CBC      10. Vitamin B12 deficiency (non anemic)  E53.8 VITAMIN B12      11. Elevated blood sugar  R73.9 HGA1C (HEMOGLOBIN A1C WITH EST AVG GLUCOSE)      12. Benign prostatic hyperplasia with nocturia  N40.1 PSA, DIAGNOSTIC    R35.1       13. Colon cancer screening  Z12.11 Referral to GENERAL SURGERY - [place] - DUREMDES, MULLINS, [facility]OPK[facility]         Orders Placed This Encounter    US  KIDNEY    IRON    IRON TRANSFERRIN AND TIBC    FERRITIN    IRON TRANSFERRIN AND TIBC    RETICULOCYTE COUNT    PROTEIN ELECTROPHORESIS, SERUM (SPEP)    RHEUMATOID FACTOR, SERUM    HEP-2 SUBSTRATE ANTINUCLEAR ANTIBODIES (ANA), SERUM    CYCLIC CITRULLINATED PEPTIDE ANTIBODIES, IGG, SERUM    SEDIMENTATION RATE    C-REACTIVE PROTEIN (CRP)    LYME ANTIBODY PANEL WITH REFLEX    CBC    LIPID PANEL    PSA, DIAGNOSTIC    VITAMIN B12    HGA1C (HEMOGLOBIN A1C WITH EST AVG GLUCOSE)    URINALYSIS, MACROSCOPIC AND MICROSCOPIC W/CULTURE REFLEX    COMPREHENSIVE METABOLIC PANEL, NON-FASTING    THYROID  STIMULATING HORMONE WITH FREE T4 REFLEX    URIC ACID    HLA-B27, BLOOD    URINALYSIS, MACROSCOPIC    URINALYSIS, MICROSCOPIC    Referral to GENERAL SURGERY - [place] - DUREMDES, MULLINS, [facility]OPKINS    ramipriL  (ALTACE ) 10 mg Oral Capsule    metoprolol  succinate (TOPROL -XL) 25 mg Oral Tablet Sustained Release 24  hr    atorvastatin  (LIPITOR) 40 mg Oral Tablet    fenofibrate  nanocrystallized (TRICOR ) 145 mg Oral Tablet    colchicine  0.6 mg Oral Tablet     Medications Discontinued During This Encounter   Medication Reason    colchicine , gout, 0.6 mg Oral Tablet Reorder    atorvastatin   (LIPITOR) 40 mg Oral Tablet Reorder    fenofibrate  nanocrystallized (TRICOR ) 145 mg Oral Tablet Reorder    ramipriL  (ALTACE ) 10 mg Oral Capsule Reorder    metoprolol  succinate (TOPROL -XL) 25 mg Oral Tablet Sustained Release 24 hr Reorder          Current statin:   Active Statin Meds   Medication Sig    atorvastatin  (LIPITOR) 40 mg Oral Tablet Take 1 Tablet (40 mg total) by mouth Daily       Assessment & Plan     1. Degenerative joint disease  Degenerative joint disease:  - Repeat inflammatory markers (CRP, ESR) to assess for active inflammation.  - Ordered fasting blood glucose to evaluate for potential diabetes mellitus.  - Ordered lipid panel to assess cholesterol levels.  - Ordered renal function tests and considered renal ultrasound to evaluate renal function and rule out renal pathology given patient's family history of renal cell carcinoma.  - Ordered HLA-B27 test to evaluate for spondyloarthropathy.  - Advised patient to follow a low-carbohydrate diet to mitigate inflammation and pain.  - Advised patient to continue with weight loss efforts and regular exercise.  - Advised patient to continue with testosterone  replacement therapy.  - Advised patient to follow up in 2 months to review laboratory results and determine if additional imaging is required.  - Recommend pneumococcal vaccination at the pharmacy.  - Prescribed a new sleep medication to address patient's insomnia.          Follow up:  Return in about 3 months (around 11/18/2023).    This note was partially created using voice recognition software and is inherently subject to errors including those of syntax and "sound alike " substitutions which may escape proof reading.  In such instances, original meaning may be extrapolated by contextual derivation.    Celeste Cola, DO  08/18/2023, 14:03      [facility], [facility]  [address]  [place] [facility] [address]  Operated by [facility]  Medicare Annual Wellness  Visit    Name: Greg Booth MRN:  [id number]   Date: 08/18/2023 Age: 53 y.o.       SUBJECTIVE:   Greg Booth is a 53 y.o. male for presenting for Medicare Wellness exam.   I have reviewed and reconciled the medication list with the patient today.        08/18/2023     1:38 PM   Comprehensive Health Assessment-Adult   Do you wish to complete this form? Yes   During the past 4 weeks, how would you rate your health in general? Good   During the past 4 weeks, how much difficulty have you had doing your usual activities inside and outside your home because of medical or emotional problems? Some difficulty   During the past 4 weeks, was someone available to help you if you needed and wanted help? Yes, as much as I wanted   In the past year, how many times have you gone to the emergency department or been admitted to a hospital for a health problem? None   Are you generally satisfied with your sleep? No   Do  you have enough money to buy things you need in everyday life, such as food, clothing, medicines, and housing? Yes, always   Can you get to places beyond walking distance without help?  (For example, can you drive your own car or travel alone on buses)? Yes   Do you fasten your seatbelt when you are in a car? Yes, usually   Do you exercise 20 minutes 3 or more days per week (such as walking, dancing, biking, mowing grass, swimming)? Yes, most of the time   How often do you eat food that is healthy (fruits, vegetables, lean meats) instead of unhealthy (sweets, fast food, junk food, fatty foods)? Some of the time   Have your parents, brothers or sisters had any of the following problems before the age of 22? (check all that apply) Heart problems, or hardening of the arteries;Diabetes (sugar);Cancer   How often do you have trouble taking medicines the eay you are told to take them? I always take them as prescribed   Do you need any help communicating with your doctors and nurses because of vision or hearing problems? No    During the past 12 months, have you experienced confusion or memory loss that is happening more often or is getting worse? No   Do you have one person you think of as your personal doctor (primary care provider or family doctor)? Yes   If you are seeing a Primary Care Provider (PCP) or family doctor. please list their name Dr. Darel Ebbs   Are you now also seeing any specialist physician(s) (such as eye doctor, foot doctor, skin doctor)? Yes   If you are seeing a specialist for anything such as foot, eye, skin, etc.  please list their name(s) [facility]   How confident are you that you can control or manage most of your health problems? Very confident       I have reviewed and updated as appropriate the past medical, family and social history. 08/18/2023 as summarized below:  Past Medical History:   Diagnosis Date    Change in bowel habits     Generalized anxiety disorder     Gout, unspecified     High cholesterol     HTN (hypertension)      History reviewed. No pertinent surgical history.  Current Outpatient Medications   Medication Sig    allopurinoL  (ZYLOPRIM ) 300 mg Oral Tablet 1 tablet by mouth daily (Patient taking differently: Take 1.5 Tablets (450 mg total) by mouth Daily 1 tablet by mouth daily)    atorvastatin  (LIPITOR) 40 mg Oral Tablet Take 1 Tablet (40 mg total) by mouth Daily    azithromycin  (ZITHROMAX ) 250 mg Oral Tablet Take 500 mg (2 tab) on day 1; take 250 mg (1 tab) on days 2-5. (Patient not taking: Reported on 08/18/2023)    clindamycin  phosphate (CLEOCIN  T) 1 % Solution Apply topically Twice daily Apply to area twice daily topically as needed to the back of the scalp    colchicine  0.6 mg Oral Tablet Take 1 Tablet (0.6 mg total) by mouth Three times a day as needed (Gout Flares)    fenofibrate  nanocrystallized (TRICOR ) 145 mg Oral Tablet Take 1 Tablet (145 mg total) by mouth Every morning before breakfast    methocarbamoL  (ROBAXIN ) 750 mg Oral Tablet Take 1 Tablet (750 mg total) by mouth Three  times a day Indications: muscle spasm    Methylprednisolone  (MEDROL  DOSEPACK) 4 mg Oral Tablets, Dose Pack Take as instructed. (Patient not  taking: Reported on 08/18/2023)    metoprolol  succinate (TOPROL -XL) 25 mg Oral Tablet Sustained Release 24 hr Take 1 Tablet (25 mg total) by mouth Daily    ramipriL  (ALTACE ) 10 mg Oral Capsule Take 1 Capsule (10 mg total) by mouth Twice daily    testosterone  cypionate (DEPO-TESTOSTERONE ) 100 mg/mL IntraMUSCULAR Oil Inject 100 mg/m2 into the muscle Every 7 days    traMADoL  (ULTRAM ) 50 mg Oral Tablet TAKE 1 TABLET BY MOUTH EVERY 6 HOURS AS NEEDED FOR PAIN     Family Medical History:       Problem Relation (Age of Onset)    Diabetes Mother, Father    Gout Mother, Father    Hypertension (High Blood Pressure) Mother, Father    Renal Tumor Father            Social History     Socioeconomic History    Marital status: Married   Tobacco Use    Smoking status: Never     Passive exposure: Never    Smokeless tobacco: Never   Vaping Use    Vaping status: Never Used   Substance and Sexual Activity    Alcohol use: Yes     Comment: "Occasional"    Drug use: Never    Sexual activity: Not Currently     Social Determinants of Health     Financial Resource Strain: Low Risk  (01/06/2023)    Financial Resource Strain     SDOH Financial: No   Transportation Needs: Low Risk  (01/06/2023)    Transportation Needs     SDOH Transportation: No   Social Connections: Low Risk  (01/06/2023)    Social Connections     SDOH Social Isolation: 5 or more times a week   Intimate Partner Violence: Low Risk  (01/06/2023)    Intimate Partner Violence     SDOH Domestic Violence: No   Housing Stability: Low Risk  (01/06/2023)    Housing Stability     SDOH Housing Situation: I have housing.     SDOH Housing Worry: No   Health Literacy: Low Risk  (11/22/2022)    Health Literacy     SDOH Health Literacy: Never   Employment Status: Low Risk  (01/06/2023)    Employment Status     SDOH Employment: Full-time work         List of  Current Health Care Providers   Care Team       PCP       Name Type Specialty Phone Number    Celeste Cola, DO Physician INTERNAL MEDICINE [phone]              Care Team       No care team found                      Health Maintenance   Topic Date Due    Colonoscopy  Never done    NonMedicare Preventative Exam  08/31/2023 (Originally 11/15/2022)    Shingles Vaccine (1 of 2) 01/06/2024 (Originally 11/12/2022)    Pneumococcal Vaccination, Age 79+ (1 of 1 - PCV) 08/17/2024 (Originally 11/12/2022)    Influenza Vaccine (Season Ended) 11/17/2023    Depression Screening  11/22/2023    Adult Tdap-Td  Discontinued    Hepatitis B Vaccine  Discontinued    Hepatitis C screening  Discontinued    HIV Screening  Discontinued    Covid-19 Vaccine  Discontinued    Prostate Cancer Screening  Discontinued  Medicare Wellness Assessment      Advance Directives                      Activities of Daily Living                   Cognitive Function Screen (1=Yes, 0=No)                                                   Fall Risk Screen       Depression Screen           Pain Score        Substance Use-Abuse Screening     Tobacco Use           Alcohol use           Prescription Drug Use                 Illicit Drug Use                                       OBJECTIVE:   BP 129/71 (Site: Right Arm, Patient Position: Sitting, Cuff Size: Adult Large)   Pulse 74   Ht 1.702 m (5\' 7" )   Wt 94.8 kg (209 lb)   SpO2 99%   BMI 32.73 kg/m        Other appropriate exam:    Health Maintenance Due   Topic Date Due    Colonoscopy  Never done      ASSESSMENT & PLAN:  Problem List Items Addressed This Visit          Cardiovascular System    HTN (hypertension) (Chronic)    Relevant Medications    ramipriL  (ALTACE ) 10 mg Oral Capsule    metoprolol  succinate (TOPROL -XL) 25 mg Oral Tablet Sustained Release 24 hr    Other Relevant Orders    CBC    Hyperlipidemia (Chronic)    Relevant Medications    atorvastatin  (LIPITOR) 40 mg Oral Tablet    fenofibrate   nanocrystallized (TRICOR ) 145 mg Oral Tablet    Other Relevant Orders    LIPID PANEL       Endocrine    Vitamin B12 deficiency (non anemic) (Chronic)    Relevant Orders    VITAMIN B12    Elevated blood sugar (Chronic)    Relevant Orders    HGA1C (HEMOGLOBIN A1C WITH EST AVG GLUCOSE)       Musculoskeletal    Hand pain, right (Chronic)    Relevant Medications    atorvastatin  (LIPITOR) 40 mg Oral Tablet       Rheumatologic    Gout, unspecified (Chronic)    Relevant Medications    colchicine  0.6 mg Oral Tablet    Other Relevant Orders    URIC ACID       Other    Benign prostatic hyperplasia with nocturia (Chronic)    Relevant Orders    PSA, DIAGNOSTIC     Other Visit Diagnoses         Family history of renal cell carcinoma    -  Primary    Relevant Orders    US  KIDNEY      High cholesterol  (Chronic)  Relevant Medications    fenofibrate  nanocrystallized (TRICOR ) 145 mg Oral Tablet    Other Relevant Orders    LIPID PANEL      Pain in other joint        Relevant Orders    RHEUMATOID FACTOR, SERUM    HEP-2 SUBSTRATE ANTINUCLEAR ANTIBODIES (ANA), SERUM    CYCLIC CITRULLINATED PEPTIDE ANTIBODIES, IGG, SERUM    SEDIMENTATION RATE    C-REACTIVE PROTEIN (CRP)    LYME ANTIBODY PANEL WITH REFLEX    PSA, DIAGNOSTIC    URINALYSIS, MACROSCOPIC AND MICROSCOPIC W/CULTURE REFLEX    COMPREHENSIVE METABOLIC PANEL, NON-FASTING    THYROID  STIMULATING HORMONE WITH FREE T4 REFLEX    HLA-B27, BLOOD      Anemia, unspecified type        Relevant Orders    IRON    IRON TRANSFERRIN AND TIBC    FERRITIN    IRON TRANSFERRIN AND TIBC    RETICULOCYTE COUNT    PROTEIN ELECTROPHORESIS, SERUM (SPEP)    CBC      Colon cancer screening        Relevant Orders    Referral to GENERAL SURGERY - [facility] - DUREMDES, MULLINS, [facility]             Identified Risk Factors/ Recommended Actions     Opioid use plan of care:         Opioids use: Plan: Assessment of pain completed and pain controlled    Orders Placed This Encounter    US  KIDNEY    IRON    IRON  TRANSFERRIN AND TIBC    FERRITIN    IRON TRANSFERRIN AND TIBC    RETICULOCYTE COUNT    PROTEIN ELECTROPHORESIS, SERUM (SPEP)    RHEUMATOID FACTOR, SERUM    HEP-2 SUBSTRATE ANTINUCLEAR ANTIBODIES (ANA), SERUM    CYCLIC CITRULLINATED PEPTIDE ANTIBODIES, IGG, SERUM    SEDIMENTATION RATE    C-REACTIVE PROTEIN (CRP)    LYME ANTIBODY PANEL WITH REFLEX    CBC    LIPID PANEL    PSA, DIAGNOSTIC    VITAMIN B12    HGA1C (HEMOGLOBIN A1C WITH EST AVG GLUCOSE)    URINALYSIS, MACROSCOPIC AND MICROSCOPIC W/CULTURE REFLEX    COMPREHENSIVE METABOLIC PANEL, NON-FASTING    THYROID  STIMULATING HORMONE WITH FREE T4 REFLEX    URIC ACID    HLA-B27, BLOOD    URINALYSIS, MACROSCOPIC    URINALYSIS, MICROSCOPIC    Referral to GENERAL SURGERY - [place] - DUREMDES, MULLINS, HOPKINS    ramipriL  (ALTACE ) 10 mg Oral Capsule    metoprolol  succinate (TOPROL -XL) 25 mg Oral Tablet Sustained Release 24 hr    atorvastatin  (LIPITOR) 40 mg Oral Tablet    fenofibrate  nanocrystallized (TRICOR ) 145 mg Oral Tablet    colchicine  0.6 mg Oral Tablet          The patient has been educated about risk factors and recommended preventive care. Written Prevention Plan completed/ updated and given to patient (see After Visit Summary).    Return in about 3 months (around 11/18/2023).    Celeste Cola, DO  08/18/2023 14:36

## 2023-08-20 LAB — CYCLIC CITRULLINATED PEPTIDE ANTIBODIES, IGG, SERUM
CYCLIC CITRULLINATED PEPTIDE ANTIBODY IGG QUAL: NEGATIVE
CYCLIC CITRULLINATED PEPTIDE ANTIBODY IGG QUANT: <0.5 U/mL (ref <3.0)

## 2023-08-20 LAB — RHEUMATOID FACTOR, SERUM: RHEUMATOID FACTOR: <13 [IU]/mL (ref <=30)

## 2023-08-20 LAB — PROTEIN ELECTROPHORESIS, SERUM (SPEP)
ALBUMIN: 4.3 g/dL (ref 3.5–5.0)
PROTEIN TOTAL: 7.2 g/dL (ref 6.0–7.9)

## 2023-08-20 LAB — LYME ANTIBODY PANEL WITH REFLEX: LYME ANTIBODY TOTAL (Screen): NEGATIVE

## 2023-08-20 LAB — HEP-2 SUBSTRATE ANTINUCLEAR ANTIBODIES (ANA), SERUM: ANA INTERPRETATION: NEGATIVE

## 2023-08-21 LAB — HLA-B27, BLOOD: HLA-B27 ANTIGEN: NEGATIVE

## 2023-08-23 ENCOUNTER — Encounter (INDEPENDENT_AMBULATORY_CARE_PROVIDER_SITE_OTHER): Payer: Self-pay | Admitting: Internal Medicine

## 2023-08-25 ENCOUNTER — Ambulatory Visit (INDEPENDENT_AMBULATORY_CARE_PROVIDER_SITE_OTHER): Payer: Self-pay

## 2023-08-25 DIAGNOSIS — M109 Gout, unspecified: Secondary | ICD-10-CM

## 2023-08-25 MED ORDER — CLONAZEPAM 0.5 MG TABLET
0.5000 mg | ORAL_TABLET | Freq: Every evening | ORAL | 1 refills | Status: DC
Start: 2023-08-25 — End: 2024-02-09

## 2023-08-25 MED ORDER — ALLOPURINOL 300 MG TABLET
450.0000 mg | ORAL_TABLET | Freq: Every day | ORAL | 2 refills | Status: AC
Start: 2023-08-25 — End: ?

## 2023-08-25 NOTE — Telephone Encounter (Signed)
 This current medication has been ERX to their pharmacy.   Celeste Cola, DO  08/25/2023 10:41

## 2023-09-02 ENCOUNTER — Encounter (INDEPENDENT_AMBULATORY_CARE_PROVIDER_SITE_OTHER): Payer: Self-pay

## 2023-09-02 ENCOUNTER — Encounter (INDEPENDENT_AMBULATORY_CARE_PROVIDER_SITE_OTHER): Payer: Self-pay | Admitting: Surgery

## 2023-09-09 ENCOUNTER — Encounter (INDEPENDENT_AMBULATORY_CARE_PROVIDER_SITE_OTHER): Admitting: Surgery

## 2023-09-11 ENCOUNTER — Ambulatory Visit (HOSPITAL_COMMUNITY): Payer: Self-pay

## 2023-09-15 ENCOUNTER — Encounter (INDEPENDENT_AMBULATORY_CARE_PROVIDER_SITE_OTHER): Admitting: Surgery

## 2023-10-14 ENCOUNTER — Encounter (INDEPENDENT_AMBULATORY_CARE_PROVIDER_SITE_OTHER): Admitting: Surgery

## 2023-11-21 ENCOUNTER — Other Ambulatory Visit (INDEPENDENT_AMBULATORY_CARE_PROVIDER_SITE_OTHER): Payer: Self-pay | Admitting: Internal Medicine

## 2023-11-21 DIAGNOSIS — I10 Essential (primary) hypertension: Secondary | ICD-10-CM

## 2023-12-08 ENCOUNTER — Other Ambulatory Visit (INDEPENDENT_AMBULATORY_CARE_PROVIDER_SITE_OTHER): Payer: Self-pay | Admitting: Internal Medicine

## 2023-12-08 DIAGNOSIS — E7800 Pure hypercholesterolemia, unspecified: Secondary | ICD-10-CM

## 2023-12-08 DIAGNOSIS — E782 Mixed hyperlipidemia: Secondary | ICD-10-CM

## 2024-01-07 ENCOUNTER — Encounter (INDEPENDENT_AMBULATORY_CARE_PROVIDER_SITE_OTHER): Payer: Self-pay | Admitting: Internal Medicine

## 2024-01-07 ENCOUNTER — Other Ambulatory Visit: Payer: Self-pay

## 2024-01-07 ENCOUNTER — Ambulatory Visit: Payer: Self-pay | Attending: Internal Medicine | Admitting: Internal Medicine

## 2024-01-07 VITALS — BP 126/74 | HR 60 | Ht 67.0 in | Wt 207.2 lb

## 2024-01-07 DIAGNOSIS — E782 Mixed hyperlipidemia: Secondary | ICD-10-CM | POA: Insufficient documentation

## 2024-01-07 DIAGNOSIS — N401 Enlarged prostate with lower urinary tract symptoms: Secondary | ICD-10-CM | POA: Insufficient documentation

## 2024-01-07 DIAGNOSIS — R739 Hyperglycemia, unspecified: Secondary | ICD-10-CM | POA: Insufficient documentation

## 2024-01-07 DIAGNOSIS — K760 Fatty (change of) liver, not elsewhere classified: Secondary | ICD-10-CM | POA: Insufficient documentation

## 2024-01-07 DIAGNOSIS — R351 Nocturia: Secondary | ICD-10-CM | POA: Insufficient documentation

## 2024-01-07 DIAGNOSIS — Z1211 Encounter for screening for malignant neoplasm of colon: Secondary | ICD-10-CM | POA: Insufficient documentation

## 2024-01-07 DIAGNOSIS — R5382 Chronic fatigue, unspecified: Secondary | ICD-10-CM | POA: Insufficient documentation

## 2024-01-07 DIAGNOSIS — I10 Essential (primary) hypertension: Secondary | ICD-10-CM | POA: Insufficient documentation

## 2024-01-07 DIAGNOSIS — E538 Deficiency of other specified B group vitamins: Secondary | ICD-10-CM | POA: Insufficient documentation

## 2024-01-07 MED ORDER — PREDNISONE 10 MG TABLET
10.0000 mg | ORAL_TABLET | ORAL | 0 refills | Status: DC
Start: 2024-01-07 — End: 2024-02-03

## 2024-01-07 NOTE — Progress Notes (Signed)
 INTERNAL MEDICINE, [facility]  [address]  [place] [address]  Operated by [facility]     Name: Greg Booth MRN:  [id number]   Date: 01/07/2024 Age: 53 y.o.       Chief Complaint:    Chief Complaint   Patient presents with    Follow Up 4 Months     Would like to see Dr. Amaryllis for a colonoscopy screening.         History of Present Illness  Greg Booth is a 53 year old male with knee bursitis who presents with a recent episode of knee bursitis.    He experienced a recent episode of bursitis in his left knee, marking the third occurrence in the past three years. The episode began last Saturday, and he started taking Motrin and icing the knee, which has led to improvement by today. The knee remains sore to touch but can be moved without significant pain. Previously, a rheumatologist treated a similar episode with prednisone , which was effective.    He has been on testosterone  replacement therapy (TRT) for about four months, which he believes has significantly improved his overall well-being, including joint pain. He has not needed to take Motrin or Tylenol since starting TRT, except for the recent bursitis episode.    He has lost some weight and maintains a moderate diet, although he is not on a strict regimen. He exercises regularly and has noticed a significant change in his weight and appearance, maintaining a weight between 200 to 205 pounds. He acknowledges that further weight loss requires more effort.    His gout has been under control, and he has not visited rheumatology recently.       Past Medical History:  Past Medical History:   Diagnosis Date    Change in bowel habits     Generalized anxiety disorder     Gout, unspecified     High cholesterol     HTN (hypertension)          History reviewed. No pertinent surgical history.   Current Outpatient Medications   Medication Sig    allopurinoL  (ZYLOPRIM ) 300 mg Oral Tablet Take 1.5 Tablets (450 mg total) by mouth  Daily 1 tablet by mouth daily    atorvastatin  (LIPITOR) 40 mg Oral Tablet Take 1 Tablet (40 mg total) by mouth Daily    clindamycin  phosphate (CLEOCIN  T) 1 % Solution Apply topically Twice daily Apply to area twice daily topically as needed to the back of the scalp    clonazePAM  (KLONOPIN ) 0.5 mg Oral Tablet Take 1 Tablet (0.5 mg total) by mouth Every night    colchicine  0.6 mg Oral Tablet Take 1 Tablet (0.6 mg total) by mouth Three times a day as needed (Gout Flares)    doxycycline  hyclate (VIBRAMYCIN ) 100 mg Oral Capsule Take 1 Capsule (100 mg total) by mouth Twice daily with food    fenofibrate  nanocrystallized (TRICOR ) 145 mg Oral Tablet TAKE 1 TABLET BY MOUTH IN THE MORNING BEFORE BREAKFAST    methocarbamoL  (ROBAXIN ) 750 mg Oral Tablet Take 1 Tablet (750 mg total) by mouth Three times a day Indications: muscle spasm    metoprolol  succinate (TOPROL -XL) 25 mg Oral Tablet Sustained Release 24 hr Take 1 Tablet (25 mg total) by mouth Daily    predniSONE  (DELTASONE ) 10 mg Oral Tablet Take 1 Tablet (10 mg total) by mouth Per instructions 1 tab tid for 3 days, 1 bid for 3 days, 1 tab daily  for 3 days, 1/2 tab daily for 3 days then d/c    ramipriL  (ALTACE ) 10 mg Oral Capsule Take 1 capsule by mouth twice daily    testosterone  cypionate (DEPO-TESTOSTERONE ) 100 mg/mL IntraMUSCULAR Oil Inject 100 mg/m2 into the muscle Every 7 days    traMADoL  (ULTRAM ) 50 mg Oral Tablet TAKE 1 TABLET BY MOUTH EVERY 6 HOURS AS NEEDED FOR PAIN     Allergies[1]    Family History:  Family Medical History:       Problem Relation (Age of Onset)    Diabetes Mother, Father    Gout Mother, Father    Hypertension (High Blood Pressure) Mother, Father    Renal Tumor Father              Social History:   Tobacco Use History[2]  Social History     Substance and Sexual Activity   Alcohol Use Yes    Comment: Occasional     Social History     Occupational History    Not on file       Review of Systems:  Review of systems as discussed in HPI    Problem  List:  Problem List[3]    Physical Examination:  BP 126/74 (Site: Left Arm, Patient Position: Sitting, Cuff Size: Adult)   Pulse 60   Ht 1.702 m (5' 7)   Wt 94 kg (207 lb 3.2 oz)   SpO2 98%   BMI 32.45 kg/m       Physical Exam  CONSTITUTIONAL: He is not in acute distress. Normal appearance.  HENT: Normocephalic. Tympanic membranes normal bilaterally. Nose normal. Mucous membranes are moist.  EYES: No scleral icterus. Extraocular movements intact. Conjunctivae normal. Pupils are equal, round, and reactive to light.  NECK: No carotid bruit.  CARDIOVASCULAR: Normal rate and regular rhythm. Normal pulses. Normal heart sounds.  PULMONARY: Pulmonary effort is normal. Normal breath sounds. No wheezing, rhonchi or rales.  ABDOMINAL: Abdomen is flat. Bowel sounds are normal. There is no abdominal tenderness. There is no guarding.  MUSCULOSKELETAL: No swelling. Normal range of motion. Pre-patellar bursitis in the knee. No tenderness in cervical back. No edema in lower legs.  SKIN: Skin is warm. Capillary refill takes less than 2 seconds.  NEUROLOGICAL: No focal deficit present. He is alert and oriented to person, place, and time. No cranial nerve deficit.           Health Maintenance:  Health Maintenance   Topic Date Due    Colonoscopy  Never done    NonMedicare Preventative Exam  01/20/2024 (Originally 11/15/2022)    Influenza Vaccine (1) 02/06/2024 (Originally 11/17/2023)    Pneumococcal Vaccination, Age 70+ (1 of 1 - PCV) 08/17/2024 (Originally 11/12/2022)    Shingles Vaccine (1 of 2) 01/06/2025 (Originally 11/12/2022)    Depression Screening  01/06/2025    Adult Tdap-Td  Discontinued    Hepatitis B Vaccine  Discontinued    Hepatitis C screening  Discontinued    HIV Screening  Discontinued    Covid-19 Vaccine (Shared decision making)  Discontinued    Prostate Cancer Screening  Discontinued        Results  Ferritin: mildly elevated       FIB-4 Calculation: 0.54 at 08/18/2023  2:21 PM  Calculated from:  SGOT/AST: 16 U/L at  08/18/2023  2:21 PM  SGPT/ALT: 12 U/L at 08/18/2023  2:21 PM  Platelets: 431 x10^3/uL at 08/18/2023  2:21 PM  Age: 27 years    Assessment:  ICD-10-CM    1. Primary hypertension  I10       2. Mixed hyperlipidemia  E78.2       3. Nonalcoholic fatty liver disease without nonalcoholic steatohepatitis (NASH)  K76.0       4. Elevated blood sugar  R73.9       5. Vitamin B12 deficiency (non anemic)  E53.8       6. Benign prostatic hyperplasia with nocturia  N40.1     R35.1       7. Chronic fatigue  R53.82          Orders Placed This Encounter    predniSONE  (DELTASONE ) 10 mg Oral Tablet     There are no discontinued medications.     Depression screening is negative. PHQ 2 Total: 0     Current statin:   Active Statin Meds   Medication Sig    atorvastatin  (LIPITOR) 40 mg Oral Tablet Take 1 Tablet (40 mg total) by mouth Daily     Assessment & Plan  Prepatellar bursitis, left knee  Intermittent prepatellar bursitis in the left knee with recent exacerbation last Saturday. Symptoms have improved with Motrin and icing. Prednisone  was effective in the past. He prefers to avoid steroid injections unless necessary.  - Prescribe prednisone  10 mg taper to use as needed if symptoms worsen.  - Advise to contact if condition does not improve or worsens.    Chronic bilateral thoracic back pain  Chronic bilateral thoracic back pain, currently well-managed with no recent exacerbations. Significant improvement in overall joint pain and function, likely due to a combination of testosterone  replacement therapy, weight loss, and lifestyle modifications. He reports not needing to take Motrin or Tylenol recently.    Gout, in remission  Gout is in remission with no recent flare-ups. He has not required rheumatology follow-up due to stable condition.    General Health Maintenance  Colonoscopy is due. Previous blood work showed mildly elevated ferritin, but no immediate need for repeat testing. Next blood work is planned for spring of next year.  -  Schedule colonoscopy.  - Plan for blood work in spring of next year.       Follow up:  Return in about 6 months (around 07/07/2024).    This note was partially created using voice recognition software and is inherently subject to errors including those of syntax and sound alike  substitutions which may escape proof reading.  In such instances, original meaning may be extrapolated by contextual derivation.    This note was created with assistance from [employer] via capture of conversational audio. Consent was obtained from the patient and all parties present prior to recording.      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

## 2024-02-03 ENCOUNTER — Ambulatory Visit (INDEPENDENT_AMBULATORY_CARE_PROVIDER_SITE_OTHER): Payer: Self-pay | Admitting: Surgery

## 2024-02-03 ENCOUNTER — Encounter (INDEPENDENT_AMBULATORY_CARE_PROVIDER_SITE_OTHER): Payer: Self-pay | Admitting: Surgery

## 2024-02-03 ENCOUNTER — Other Ambulatory Visit: Payer: Self-pay

## 2024-02-03 DIAGNOSIS — Z1211 Encounter for screening for malignant neoplasm of colon: Secondary | ICD-10-CM

## 2024-02-03 DIAGNOSIS — Z01818 Encounter for other preprocedural examination: Secondary | ICD-10-CM

## 2024-02-03 MED ORDER — SODIUM SUL 1.479 GRAM-POTAS CH 0.188 GRAM-MAGNES SUL 0.225 GRAM TABLET: ORAL_TABLET | ORAL | 0 refills | Status: DC

## 2024-02-05 ENCOUNTER — Encounter (INDEPENDENT_AMBULATORY_CARE_PROVIDER_SITE_OTHER): Payer: Self-pay | Admitting: Surgery

## 2024-02-05 NOTE — Progress Notes (Signed)
 GENERAL SURGERY, [facility]  [address]  [place] [address]    History and Physical    Name: Greg Booth MRN:  [id number]   Date: 02/03/2024 DOB:  Aug 29, 1970 (53 y.o.)              Reason for Visit: Colonoscopy    History of Present Illness  Greg Booth presents today for screening colonoscopy.  The patient denies any other problems.    Negative diabetes, blood thinner, family history colon cancer      Review of the result(s) of each unique test:  Patient underwent diagnostic testing ( none ) prior to this dates visit.  I have personally reviewed the results and that serves as a component of the medical decision making for this encounter       Review of prior external note(s) from each unique source:  Patients referral to this office including a recent assessment by the referring provider.  This was reviewed by me for this unique office visit for the indication and intent of the referral as well as any pertinent medical or surgical history relevant to the patients independent evaluation by me today.      Patient History  Past Medical History:   Diagnosis Date    Change in bowel habits     Generalized anxiety disorder     Gout, unspecified     High cholesterol     HTN (hypertension)          Past Surgical History:   Procedure Laterality Date    WRIST SURGERY           Current Outpatient Medications   Medication Sig    allopurinoL  (ZYLOPRIM ) 300 mg Oral Tablet Take 1.5 Tablets (450 mg total) by mouth Daily 1 tablet by mouth daily    atorvastatin  (LIPITOR) 40 mg Oral Tablet Take 1 Tablet (40 mg total) by mouth Daily    clindamycin  phosphate (CLEOCIN  T) 1 % Solution Apply topically Twice daily Apply to area twice daily topically as needed to the back of the scalp    clonazePAM  (KLONOPIN ) 0.5 mg Oral Tablet Take 1 Tablet (0.5 mg total) by mouth Every night    colchicine  0.6 mg Oral Tablet Take 1 Tablet (0.6 mg total) by mouth Three times a day as needed (Gout Flares)     doxycycline  hyclate (VIBRAMYCIN ) 100 mg Oral Capsule Take 1 Capsule (100 mg total) by mouth Twice daily with food    fenofibrate  nanocrystallized (TRICOR ) 145 mg Oral Tablet TAKE 1 TABLET BY MOUTH IN THE MORNING BEFORE BREAKFAST    methocarbamoL  (ROBAXIN ) 750 mg Oral Tablet Take 1 Tablet (750 mg total) by mouth Three times a day Indications: muscle spasm    metoprolol  succinate (TOPROL -XL) 25 mg Oral Tablet Sustained Release 24 hr Take 1 Tablet (25 mg total) by mouth Daily    ramipriL  (ALTACE ) 10 mg Oral Capsule Take 1 capsule by mouth twice daily    sod sulf-pot chloride-mag sulf 1.479-0.188- 0.225 gram Oral Tablet Day before Procedure Take 12 pills between 10:00 am to 10:30 am Take 12 pills between 6:00 pm to 6:30 pm Drink each with 32 oz water.    testosterone  cypionate (DEPO-TESTOSTERONE ) 100 mg/mL IntraMUSCULAR Oil Inject 100 mg/m2 into the muscle Every 7 days    traMADoL  (ULTRAM ) 50 mg Oral Tablet TAKE 1 TABLET BY MOUTH EVERY 6 HOURS AS NEEDED FOR PAIN     Allergies[1]  Family Medical History:  Problem Relation (Age of Onset)    Diabetes Mother, Father    Gout Mother, Father    Hypertension (High Blood Pressure) Mother, Father    Renal Tumor Father            Social History[2]         Physical Examination:  Vitals:    02/03/24 1331   BP: 117/70   Pulse: 69   Temp: 36.6 C (97.9 F)   SpO2: 97%   Weight: 96.2 kg (212 lb)   Height: 1.702 m (5' 7)   BMI: 33.2        General: appropriate for age. in no acute distress.    Vital signs are present above and have been reviewed by me     HEENT: Atraumatic, Normocephalic. PERRLA. EOMI. Nose clear. Throat clear    Lungs: Nonlabored breathing with symmetric expansion. Clear to auscultation bilaterally    Heart:Regular wth respect to rate and rythmn.    Abdomen:Soft. Nontender. Nondistended and benign    Extremities: Grossly normal. No major deformities     Neuro:  Grossly normal motor and sensory function    Psychiatric: Alert and oriented to person, place, and  time. affect appropriate      Assessment and Plan  Screening colonoscopy scheduled for 04/07/2024 at 7:30 a.m.     Discussed indications, risks, and benefits of colonoscopy with possible biopsy/polypectomy with the patient.  Discussed the possibility of polypectomy, biopsies, and possible repeat examinations.  Risks include bleeding, sedation risks, possibility of missed diagnosis of polyp or malignancy, and remote possibilities of perforation and death.  All questions were answered, and informed consent was clearly obtained.      Follow Up:  No follow-ups on file.      ICD-10-CM    1. Colon cancer screening  Z12.11           Jadden Yim B Lisa Blakeman, MD ,MBA,FACS    I appreciate the opportunity to be involved in the care of your patients.  If you have any questions or concerns regarding this encounter, please do not hesitate to contact me at your convenience.      This note may have been partially generated using MModal Fluency Direct system, and there may be some incorrect words, spellings, and punctuation that were not noted in checking the note before saving, though effort was made to avoid such errors.               [1]   Allergies  Allergen Reactions    Penicillins Rash    Sulfa (Sulfonamides) Rash   [2]   Social History  Tobacco Use    Smoking status: Never     Passive exposure: Never    Smokeless tobacco: Never   Vaping Use    Vaping status: Never Used   Substance Use Topics    Alcohol use: Yes     Comment: Occasional    Drug use: Never

## 2024-02-06 ENCOUNTER — Other Ambulatory Visit (INDEPENDENT_AMBULATORY_CARE_PROVIDER_SITE_OTHER): Payer: Self-pay | Admitting: Internal Medicine

## 2024-02-07 ENCOUNTER — Other Ambulatory Visit (INDEPENDENT_AMBULATORY_CARE_PROVIDER_SITE_OTHER): Payer: Self-pay | Admitting: Internal Medicine

## 2024-02-07 DIAGNOSIS — I10 Essential (primary) hypertension: Secondary | ICD-10-CM

## 2024-02-17 ENCOUNTER — Encounter (INDEPENDENT_AMBULATORY_CARE_PROVIDER_SITE_OTHER): Payer: Self-pay | Admitting: Internal Medicine

## 2024-02-17 ENCOUNTER — Ambulatory Visit: Admitting: Internal Medicine

## 2024-02-17 ENCOUNTER — Other Ambulatory Visit: Payer: Self-pay

## 2024-02-17 VITALS — BP 124/85 | HR 83 | Ht 67.0 in | Wt 214.0 lb

## 2024-02-17 DIAGNOSIS — E782 Mixed hyperlipidemia: Secondary | ICD-10-CM

## 2024-02-17 DIAGNOSIS — H6090 Unspecified otitis externa, unspecified ear: Secondary | ICD-10-CM

## 2024-02-17 DIAGNOSIS — K760 Fatty (change of) liver, not elsewhere classified: Secondary | ICD-10-CM

## 2024-02-17 DIAGNOSIS — I10 Essential (primary) hypertension: Secondary | ICD-10-CM

## 2024-02-17 DIAGNOSIS — H6120 Impacted cerumen, unspecified ear: Secondary | ICD-10-CM

## 2024-02-17 MED ORDER — CIPROFLOXACIN 500 MG TABLET: 500.0000 mg | ORAL_TABLET | Freq: Two times a day (BID) | ORAL | 0 refills | Status: DC

## 2024-02-17 MED ORDER — CIPROFLOXACIN 0.3 %-DEXAMETHASONE 0.1 % EAR DROPS,SUSPENSION: 4.0000 [drp] | Freq: Two times a day (BID) | OTIC | 0 refills | Status: AC

## 2024-02-17 NOTE — Progress Notes (Signed)
 INTERNAL MEDICINE, [facility]  [address]  [place] [address]  Operated by [facility]     Name: Greg Booth MRN:  [id number]   Date: 02/17/2024 Age: 53 y.o.       Chief Complaint:    Chief Complaint   Patient presents with    Ear Infection     Rt ear is infected.        History of Present Illness  Greg Booth is a 53 year old male who presents with symptoms of an ear infection.    He has soreness in the ear canal, describing it as similar to 'swimmer's ear'. The canal feels full and swollen, and he initially thought he was developing a bump. About a week ago, he irrigated his ear to remove ear wax, which he suspects may have contributed to the current symptoms.    He recalls a previous severe case of swimmer's ear after visiting the beach, which took about a month to resolve, causing him anxiety about the current situation.    He mentions a history of having his ears cleaned by an ENT specialist, who has since retired, and occasionally by an biomedical scientist. He has not found a new specialist since then.    No recent chest pain, shortness of breath, or nasal congestion, although he had nasal congestion about a week ago. He has gained some weight recently but attributes it to holiday activities.       Past Medical History:  Past Medical History:   Diagnosis Date    Change in bowel habits     Generalized anxiety disorder     Gout, unspecified     High cholesterol     HTN (hypertension)          Past Surgical History:   Procedure Laterality Date    WRIST SURGERY        Current Outpatient Medications   Medication Sig    allopurinoL  (ZYLOPRIM ) 300 mg Oral Tablet Take 1.5 Tablets (450 mg total) by mouth Daily 1 tablet by mouth daily    atorvastatin  (LIPITOR) 40 mg Oral Tablet Take 1 Tablet (40 mg total) by mouth Daily    ciprofloxacin  HCl (CIPRO ) 500 mg Oral Tablet Take 1 Tablet (500 mg total) by mouth Twice daily    ciprofloxacin -dexAMETHasone  (CIPRODEX ) 0.3-0.1 % Otic  Drops, Suspension Instill 4 Drops into both ears Twice daily for 7 days    clindamycin  phosphate (CLEOCIN  T) 1 % Solution Apply topically Twice daily Apply to area twice daily topically as needed to the back of the scalp    clonazePAM  (KLONOPIN ) 0.5 mg Oral Tablet TAKE 1 TABLET BY MOUTH ONCE DAILY AT NIGHT    colchicine  0.6 mg Oral Tablet Take 1 Tablet (0.6 mg total) by mouth Three times a day as needed (Gout Flares)    doxycycline  hyclate (VIBRAMYCIN ) 100 mg Oral Capsule Take 1 Capsule (100 mg total) by mouth Twice daily with food    fenofibrate  nanocrystallized (TRICOR ) 145 mg Oral Tablet TAKE 1 TABLET BY MOUTH IN THE MORNING BEFORE BREAKFAST    methocarbamoL  (ROBAXIN ) 750 mg Oral Tablet Take 1 Tablet (750 mg total) by mouth Three times a day Indications: muscle spasm    metoprolol  succinate (TOPROL -XL) 25 mg Oral Tablet Sustained Release 24 hr Take 1 tablet by mouth once daily    ramipriL  (ALTACE ) 10 mg Oral Capsule Take 1 capsule by mouth twice daily    sod sulf-pot chloride-mag sulf 1.479-0.188- 0.225 gram  Oral Tablet Day before Procedure Take 12 pills between 10:00 am to 10:30 am Take 12 pills between 6:00 pm to 6:30 pm Drink each with 32 oz water.    testosterone  cypionate (DEPO-TESTOSTERONE ) 100 mg/mL IntraMUSCULAR Oil Inject 100 mg/m2 into the muscle Every 7 days    traMADoL  (ULTRAM ) 50 mg Oral Tablet TAKE 1 TABLET BY MOUTH EVERY 6 HOURS AS NEEDED FOR PAIN     Allergies[1]    Family History:  Family Medical History:       Problem Relation (Age of Onset)    Diabetes Mother, Father    Gout Mother, Father    Hypertension (High Blood Pressure) Mother, Father    Renal Tumor Father              Social History:   Tobacco Use History[2]  Social History     Substance and Sexual Activity   Alcohol Use Yes    Comment: Occasional     Social History     Occupational History    Not on file       Review of Systems:  Review of systems as discussed in HPI    Problem List:  Problem List[3]    Physical Examination:  BP  124/85 (Site: Left Arm, Patient Position: Sitting, Cuff Size: Adult Large)   Pulse 83   Ht 1.702 m (5' 7)   Wt 97.1 kg (214 lb)   SpO2 97%   BMI 33.52 kg/m       Physical Exam  CONSTITUTIONAL: He is not in acute distress. Normal appearance.  HENT: Head is normocephalic. Right ear with tympanic membrane normal. Left ear with tympanic membrane normal. Ear canal inflamed, red, and irritated. Cerumen present in ear canal. Nose normal. Mouth mucous membranes are moist.  EYES: No scleral icterus. Extraocular movements intact. Conjunctivae normal. Pupils are equal, round, and reactive to light.  NECK: No carotid bruit.  CARDIOVASCULAR: Normal rate and regular rhythm. Normal pulses. Normal heart sounds.  PULMONARY: Pulmonary effort is normal. Normal breath sounds. No wheezing, rhonchi or rales.  ABDOMINAL: Abdomen is flat. Bowel sounds are normal. There is no abdominal tenderness. There is no guarding.  MUSCULOSKELETAL: No swelling. Normal range of motion. Cervical back with normal range of motion. No tenderness. Right lower leg with no edema. Left lower leg with no edema.  SKIN: Skin is warm. Capillary refill takes less than 2 seconds.  NEUROLOGICAL: No focal deficit present. He is alert and oriented to person, place, and time. No cranial nerve deficit.     Health Maintenance:  Health Maintenance   Topic Date Due    NonMedicare Preventative Exam  11/15/2022    Influenza Vaccine (1) 03/19/2024 (Originally 11/17/2023)    Colonoscopy  08/17/2024 (Originally 11/11/2017)    Pneumococcal Vaccination, Age 26+ (1 of 1 - PCV) 08/17/2024 (Originally 11/12/2022)    Shingles Vaccine (1 of 2) 01/06/2025 (Originally 11/12/2022)    Depression Screening  01/06/2025    Tetanus-Diptheria Vaccines  Discontinued    Hepatitis B Vaccine  Discontinued    Hepatitis C screening  Discontinued    HIV Screening  Discontinued    Covid-19 Vaccine (Shared decision making)  Discontinued    Prostate Cancer Screening  Discontinued      Results        FIB-4 Calculation: 0.54 at 08/18/2023  2:21 PM  Calculated from:  SGOT/AST: 16 U/L at 08/18/2023  2:21 PM  SGPT/ALT: 12 U/L at 08/18/2023  2:21 PM  Platelets: 431 x10^3/uL at 08/18/2023  2:21 PM  Age: 53 years      Assessment:      ICD-10-CM    1. Primary hypertension  I10       2. Otitis externa, unspecified chronicity, unspecified laterality, unspecified type  H60.90 ciprofloxacin  HCl (CIPRO ) 500 mg Oral Tablet     ciprofloxacin -dexAMETHasone  (CIPRODEX ) 0.3-0.1 % Otic Drops, Suspension      3. Mixed hyperlipidemia  E78.2       4. Nonalcoholic fatty liver disease without nonalcoholic steatohepatitis (NASH)  K76.0       5. Impacted cerumen, unspecified laterality  H61.20          Orders Placed This Encounter    ciprofloxacin  HCl (CIPRO ) 500 mg Oral Tablet    ciprofloxacin -dexAMETHasone  (CIPRODEX ) 0.3-0.1 % Otic Drops, Suspension     There are no discontinued medications.       Current statin:   Active Statin Meds   Medication Sig    atorvastatin  (LIPITOR) 40 mg Oral Tablet Take 1 Tablet (40 mg total) by mouth Daily     Assessment & Plan  Otitis externa, unspecified ear  Inflamed and red ear canal with irritation, likely due to otitis externa. Possible contributing factors include recent ear irrigation and presence of cerumen. Differential includes seborrheic dermatitis, but infection is more likely. Recurrent infections and sensitive ear canals noted.  - Prescribed Cipro  500 mg orally twice daily for 7 days.  - Provided prescription for ear drops for future use if irritation recurs after cleaning.    Impacted cerumen, unspecified ear  Significant amount of cerumen present in the ear canal, darker in color, indicating possible retention. Recent irrigation removed some cerumen, but significant amount remains. Advised against further cleaning to prevent irritation and potential infection.  - Advised against further ear cleaning to prevent irritation and potential infection.  - Discussed option of ENT referral for  professional ear cleaning if needed.       Follow up:  Return in about 3 months (around 05/17/2024).    This note was partially created using voice recognition software and is inherently subject to errors including those of syntax and sound alike  substitutions which may escape proof reading.  In such instances, original meaning may be extrapolated by contextual derivation.    This note was created with assistance from [employer] via capture of conversational audio. Consent was obtained from the patient and all parties present prior to recording.      Krystal DELENA Sharps, DO  02/17/2024, 12:47         [1]   Allergies  Allergen Reactions    Penicillins Rash    Sulfa (Sulfonamides) Rash   [2]   Social History  Tobacco Use   Smoking Status Never    Passive exposure: Never   Smokeless Tobacco Never   [3]   Patient Active Problem List  Diagnosis    Gout, unspecified    HTN (hypertension)    Generalized anxiety disorder    Nonalcoholic fatty liver disease without nonalcoholic steatohepatitis (NASH)    Idiopathic chronic gout of multiple sites without tophus    Vitamin D deficiency    Vitamin B12 deficiency (non anemic)    Hyperlipidemia    Annual physical exam    MRSA (methicillin resistant Staphylococcus aureus) carrier    Benign prostatic hyperplasia with nocturia    Elevated blood sugar    Chronic fatigue    Hand pain, right

## 2024-02-19 ENCOUNTER — Other Ambulatory Visit (INDEPENDENT_AMBULATORY_CARE_PROVIDER_SITE_OTHER): Payer: Self-pay | Admitting: Internal Medicine

## 2024-02-19 ENCOUNTER — Encounter (INDEPENDENT_AMBULATORY_CARE_PROVIDER_SITE_OTHER): Payer: Self-pay | Admitting: Internal Medicine

## 2024-02-19 MED ORDER — PREDNISONE 10 MG TABLET: 10.0000 mg | ORAL_TABLET | ORAL | 0 refills | Status: AC

## 2024-02-19 NOTE — Telephone Encounter (Signed)
 Prednisone  10 mg TID for 3 days, 1 tab BID for 3 days, 1 tab daily for 3 days, 1/2 tqb daily for 3 days then discontinue #20

## 2024-02-20 ENCOUNTER — Other Ambulatory Visit (INDEPENDENT_AMBULATORY_CARE_PROVIDER_SITE_OTHER): Payer: Self-pay | Admitting: Internal Medicine

## 2024-02-20 DIAGNOSIS — E785 Hyperlipidemia, unspecified: Secondary | ICD-10-CM

## 2024-02-20 DIAGNOSIS — I10 Essential (primary) hypertension: Secondary | ICD-10-CM

## 2024-02-20 DIAGNOSIS — M79641 Pain in right hand: Secondary | ICD-10-CM

## 2024-03-20 ENCOUNTER — Other Ambulatory Visit (INDEPENDENT_AMBULATORY_CARE_PROVIDER_SITE_OTHER): Payer: Self-pay | Admitting: NURSE PRACTITIONER

## 2024-03-28 ENCOUNTER — Other Ambulatory Visit (INDEPENDENT_AMBULATORY_CARE_PROVIDER_SITE_OTHER): Payer: Self-pay | Admitting: Internal Medicine

## 2024-03-28 DIAGNOSIS — E782 Mixed hyperlipidemia: Secondary | ICD-10-CM

## 2024-03-28 DIAGNOSIS — E7800 Pure hypercholesterolemia, unspecified: Secondary | ICD-10-CM

## 2024-04-07 ENCOUNTER — Encounter (HOSPITAL_COMMUNITY): Payer: Self-pay | Admitting: Surgery

## 2024-04-07 ENCOUNTER — Ambulatory Visit (HOSPITAL_COMMUNITY)

## 2024-04-07 ENCOUNTER — Other Ambulatory Visit: Payer: Self-pay

## 2024-04-07 ENCOUNTER — Encounter (HOSPITAL_COMMUNITY)
Admission: RE | Disposition: A | Discharge status: Home / Self Care | Payer: Self-pay | Source: Ambulatory Visit | Attending: Surgery

## 2024-04-07 ENCOUNTER — Ambulatory Visit (HOSPITAL_COMMUNITY): Admitting: Surgery

## 2024-04-07 ENCOUNTER — Ambulatory Visit
Admission: RE | Admit: 2024-04-07 | Discharge: 2024-04-07 | Disposition: A | Discharge status: Home / Self Care | Source: Ambulatory Visit | Attending: Surgery | Admitting: Surgery

## 2024-04-07 DIAGNOSIS — Z1211 Encounter for screening for malignant neoplasm of colon: Secondary | ICD-10-CM | POA: Insufficient documentation

## 2024-04-07 MED ORDER — LIDOCAINE (PF) 20 MG/ML (2 %) INJECTION SOLUTION
INTRAMUSCULAR | Status: AC
  Filled 2024-04-07: qty 5

## 2024-04-07 MED ORDER — PROPOFOL 10 MG/ML INTRAVENOUS EMULSION
INTRAVENOUS | Status: DC | PRN
  Administered 2024-04-07: 400 ug/kg/min via INTRAVENOUS
  Administered 2024-04-07: 0 ug/kg/min via INTRAVENOUS

## 2024-04-07 MED ORDER — PROPOFOL 10 MG/ML INTRAVENOUS EMULSION
INTRAVENOUS | Status: AC
  Filled 2024-04-07: qty 20

## 2024-04-07 MED ORDER — LACTATED RINGERS INTRAVENOUS SOLUTION: INTRAVENOUS | Status: DC | PRN

## 2024-04-07 MED ORDER — LIDOCAINE (PF) 100 MG/5 ML (2 %) INTRAVENOUS SYRINGE
INJECTION | Freq: Once | INTRAVENOUS | Status: DC | PRN
  Administered 2024-04-07: 100 mg via INTRAVENOUS

## 2024-04-07 NOTE — OR Surgeon (Signed)
 [facility]      Patient Name: Greg, Booth Number: [id number]  Date of Service: 04/07/2024   Date of Birth: 1971/03/16      Pre-Operative Diagnosis: Encounter for screening colonoscopy [Z12.11]     Post-Operative Diagnosis: NORMAL COLONOSCOPY    Procedure(s)/Description:  COLONOSCOPY: 54621 (CPT)       Attending Surgeon: Amaryllis KATHEE Denise, MD     Anesthesia:  CRNA: Awilda Norris, CRNA    Anesthesia Type: .General     Specimens Removed: NONE    The patient indicates that they have read and understood the preoperative colonoscopy consent form. The benefits, risks and alternatives to the procedure were discussed. I specifically discussed the risk of bleeding and/or perforation requiring operation.  The patient was given ample opportunity to ask questions which were addressed to the patient's satisfaction.  The patient indicates they have no further question and wish to proceed. Informed consent was obtained from the patient and/or medical power of attorney.    The patient was brought into the procedure room and placed on the table in the left lateral decubitus position. After IV sedation was given, full finger digital rectal examination was performed with a circumferential sweep of the distal rectal mucosa. Subsequently, the flexible colonoscope was inserted into the rectum and passed without any difficulty. The colonoscope was then advanced up into the sigmoid colon, descending colon, transverse colon, right colon and cecum without any difficulty. Gross examination of each section of the colon was performed showing no specific abnormalities seen. Cecal intubation was achieved and the appendiceal orifice and ileocecal valve were identified. The colonoscope was withdrawn carefully examining the mucosa as the scope was being extracted with particular attention paid to the proximal sides of folds, flexures, bends and rectal valves. At approximately 10 cm. from the anal verge, the  colonoscope was retroflexed to fully examine the distal rectum. The colonoscope was removed and a repeat digital rectal examination was performed at the completion of the procedure. The patient tolerated the procedure well. No intraoperative complications were encountered.    EKG, pulse, pulse oximetry and blood pressure were monitored throughout the entire procedure.  There were no unplanned events.  The patient was instructed to contact me if they have any problems with their colon such as bleeding, pain or changes in bowel habits. They understood and agreed to do so.  The patient will not need another screening colonoscopy for 10 years which is according to ASGE guidelines. However, if in the future the patient has any problems with abdominal pain, changes in bowel habits, blood in stool, etc., then they should contact me because they may be a candidate for diagnostic colonoscopy before the 10 year time limit.    Jery Hollern B. Claudio Mondry, MD, MBA, FACS  [facility] -General Surgery

## 2024-04-07 NOTE — H&P (Signed)
 [facility]  General Surgery  History and Physical    Date of Service:  04/07/2024  Greg, Booth, 54 y.o. male  Date of Admission:  04/07/2024  Date of Birth:  1971-02-21  PCP: Krystal DELENA Sharps, DO    Reason for admission:  Colonoscopy    HPI:  Greg Booth is a 54 y.o. White male who is admitted for Encounter for screening colonoscopy [Z12.11]     Greg Booth presents today for screening colonoscopy.  The patient denies any other problems.     Negative diabetes, blood thinner, family history colon cancer        Review of the result(s) of each unique test:  Patient underwent diagnostic testing ( none ) prior to this dates visit.  I have personally reviewed the results and that serves as a component of the medical decision making for this encounter        Review of prior external note(s) from each unique source:  Patients referral to this office including a recent assessment by the referring provider.  This was reviewed by me for this unique office visit for the indication and intent of the referral as well as any pertinent medical or surgical history relevant to the patients independent evaluation by me today.    Past Medical History:   Diagnosis Date    Change in bowel habits     Generalized anxiety disorder     Gout, unspecified     High cholesterol     HTN (hypertension)       Past Surgical History:   Procedure Laterality Date    WRIST SURGERY        Social History[1]    Family Medical History:       Problem Relation (Age of Onset)    Diabetes Mother, Father    Gout Mother, Father    Hypertension (High Blood Pressure) Mother, Father    Renal Tumor Father           Medications Prior to Admission       Prescriptions    allopurinoL  (ZYLOPRIM ) 300 mg Oral Tablet    Take 1.5 Tablets (450 mg total) by mouth Daily 1 tablet by mouth daily    atorvastatin  (LIPITOR) 40 mg Oral Tablet    Take 1 tablet by mouth once daily    clindamycin  phosphate (CLEOCIN  T) 1 % Solution    Apply topically Twice daily Apply  to area twice daily topically as needed to the back of the scalp    clonazePAM  (KLONOPIN ) 0.5 mg Oral Tablet    TAKE 1 TABLET BY MOUTH ONCE DAILY AT NIGHT    colchicine  0.6 mg Oral Tablet    Take 1 Tablet (0.6 mg total) by mouth Three times a day as needed (Gout Flares)    doxycycline  hyclate (VIBRAMYCIN ) 100 mg Oral Capsule    Take 1 Capsule (100 mg total) by mouth Twice daily with food    fenofibrate  nanocrystallized (TRICOR ) 145 mg Oral Tablet    TAKE 1 TABLET BY MOUTH IN THE MORNING BEFORE BREAKFAST    methocarbamoL  (ROBAXIN ) 750 mg Oral Tablet    Take 1 Tablet (750 mg total) by mouth Three times a day Indications: muscle spasm    metoprolol  succinate (TOPROL -XL) 25 mg Oral Tablet Sustained Release 24 hr    Take 1 tablet by mouth once daily    predniSONE  (DELTASONE ) 10 mg Oral Tablet    Take 1 Tablet (10 mg total) by mouth Per instructions 1  tab tid for 3 days, 1 bid for 3 days, 1 tab daily for 3 days, 1/2 tab daily for 3 days then d/c    ramipriL  (ALTACE ) 10 mg Oral Capsule    Take 1 capsule by mouth twice daily    testosterone  cypionate (DEPO-TESTOSTERONE ) 100 mg/mL IntraMUSCULAR Oil    Inject 100 mg/m2 into the muscle Every 7 days    traMADoL  (ULTRAM ) 50 mg Oral Tablet    TAKE 1 TABLET BY MOUTH EVERY 6 HOURS AS NEEDED FOR PAIN           Allergies[2]       Patient Vitals for the past 24 hrs:   BP Temp Pulse Resp SpO2 Height Weight   04/07/24 0700 114/67 36.8 C (98.2 F) 77 19 96 % 1.727 m (5' 8) 96.2 kg (212 lb)          General: appropriate for age. in no acute distress.    Vital signs are present above and have been reviewed by me     HEENT: Atraumatic, Normocephalic. PERRLA, EOMI. Nose clear. Throat clear.    Lungs: Nonlabored breathing with symmetric expansion.  Clear to auscultation bilaterally    Heart:Regular wth respect to rate and rythmn.    Abdomen:Soft. Nontender. Nondistended and benign    Extremities:  Grossly normal with good range of motion and no major deformities.    Neuro:  Grossly normal  motor and sensory function. CN's II through XII intact.    Psychiatric: Alert and oriented to person, place, and time. affect appropriate    Laboratory Data:     No results found for any visits on 04/07/24 (from the past 24 hours).    Imaging Studies:    No orders to display        Assessment/Plan:  Encounter for screening colonoscopy [Z12.11]    Screening colonoscopy scheduled for 04/07/2024 at 7:30 a.m.      Discussed indications, risks, and benefits of colonoscopy with possible biopsy/polypectomy with the patient.  Discussed the possibility of polypectomy, biopsies, and possible repeat examinations.  Risks include bleeding, sedation risks, possibility of missed diagnosis of polyp or malignancy, and remote possibilities of perforation and death.  All questions were answered, and informed consent was clearly obtained.    This note was partially created using voice recognition software and is inherently subject to errors including those of syntax and sound alike  substitutions which may escape proof reading. In such instances, original meaning may be extrapolated by contextual derivation.    Greg Booth B Greg Teschner, MD, MBA, FACS         [1]   Social History  Tobacco Use    Smoking status: Never     Passive exposure: Never    Smokeless tobacco: Never   Vaping Use    Vaping status: Never Used   Substance Use Topics    Alcohol use: Yes     Comment: Occasional    Drug use: Never   [2]   Allergies  Allergen Reactions    Penicillins Rash    Sulfa (Sulfonamides) Rash

## 2024-04-07 NOTE — Anesthesia Postprocedure Evaluation (Signed)
 Anesthesia Post Op Evaluation    Patient: Greg Booth  Procedure(s):  COLONOSCOPY    Last Vitals:Temperature: 36.3 C (97.4 F) (04/07/24 0816)  Heart Rate: 75 (04/07/24 0816)  BP (Non-Invasive): 101/71 (04/07/24 0817)  Respiratory Rate: 18 (04/07/24 0816)  SpO2: 98 % (04/07/24 0817)    No notable events documented.    Patient is sufficiently recovered from the effects of anesthesia to participate in the evaluation and has returned to their pre-procedure level.  Patient location during evaluation: PACU       Patient participation: complete - patient participated  Level of consciousness: awake and alert and responsive to verbal stimuli    Pain management: adequate  Airway patency: patent    Anesthetic complications: no  Cardiovascular status: acceptable  Respiratory status: acceptable  Hydration status: acceptable  Patient post-procedure temperature: Pt Normothermic   PONV Status: Absent

## 2024-04-07 NOTE — Anesthesia Preprocedure Evaluation (Signed)
 ANESTHESIA PRE-OP EVALUATION  Planned Procedure: COLONOSCOPY  Review of Systems     anesthesia history negative     patient summary reviewed  nursing notes reviewed        Pulmonary  negative pulmonary ROS,    Cardiovascular    Hypertension, well controlled, ECG reviewed and hyperlipidemia ,No peripheral edema,  Exercise Tolerance: > or = 4 METS        GI/Hepatic/Renal    liver disease and kidney stones        Endo/Other   neg endo/other ROS,       Neuro/Psych/MS   negative neuro/psych ROS,      Cancer    negative hematology/oncology ROS,                     Physical Assessment      Airway       Mallampati: II    TM distance: >3 FB    Neck ROM: full  Mouth Opening: fair.  Facial hair  Beard        Dental       Dentition intact             Pulmonary    Breath sounds clear to auscultation  (-) no rhonchi, no decreased breath sounds, no wheezes, no rales and no stridor     Cardiovascular    Rhythm: regular  Rate: Normal  (-) no friction rub, carotid bruit is not present, no peripheral edema and no murmur     Other findings            Plan  ASA 2     Planned anesthesia type: general     total intravenous anesthesia            SLEEP APNEA  Patient is at risk of obstructive sleep apnea        Intravenous induction       Anesthetic plan and risks discussed with patient  signed consent obtained      Use of blood products discussed with patient who consented to blood products.      Patient's NPO status is appropriate for Anesthesia.

## 2024-04-07 NOTE — Anesthesia Transfer of Care (Signed)
 ANESTHESIA TRANSFER OF CARE   Greg Booth is a 54 y.o. ,male, Weight: 96.2 kg (212 lb)   had Procedure(s):  COLONOSCOPY  performed  04/07/2024   Primary Service: Amaryllis KATHEE Denise, MD    Past Medical History:   Diagnosis Date    Change in bowel habits     Generalized anxiety disorder     Gout, unspecified     High cholesterol     HTN (hypertension)       Allergy History as of 04/07/24       PENICILLINS         Noted Status Severity Type Reaction    08/11/21 0811 Betti Males, RN 01/05/16 Active Medium  Rash              SULFA (SULFONAMIDES)         Noted Status Severity Type Reaction    08/11/21 0811 Betti Males, RN 01/05/16 Active Medium  Rash                  I completed my transfer of care / handoff to the receiving personnel during which we discussed:  Access, Airway, All key/critical aspects of case discussed, Analgesia, Antibiotics, Expectation of post procedure, Fluids/Product, Gave opportunity for questions and acknowledgement of understanding, Labs and PMHx      Post Location: PACU                                                           Last OR Temp: Temperature: 36.8 C (98.2 F)      Airway:* No LDAs found *  Blood pressure 114/67, pulse 77, temperature 36.8 C (98.2 F), resp. rate 19, height 1.727 m (5' 8), weight 96.2 kg (212 lb), SpO2 96%.

## 2024-04-07 NOTE — Discharge Instructions (Signed)
 SURGICAL DISCHARGE INSTRUCTIONS     Dr. Marlyce, Gene B, MD  performed your COLONOSCOPY today at the [facility]    [facility]:  Monday through Friday from 8 a.m. - 4 p.m.: [phone]    For T&D: [phone]  Between 4 p.m. - 8 a.m., weekends and holidays:  Call ER [phone]    PLEASE SEE WRITTEN HANDOUTS AS DISCUSSED BY YOUR NURSE:  Prabhjot Piscitello RN    SIGNS AND SYMPTOMS OF A WOUND / INCISION INFECTION   Be sure to watch for the following:  Increase in redness or red streaks near or around the wound or incision.  Increase in pain that is intense or severe and cannot be relieved by the pain medication that your doctor has given you.  Increase in swelling that cannot be relieved by elevation of a body part, or by applying ice, if permitted.  Increase in drainage, or if yellow / green in color and smells bad. This could be on a dressing or a cast.  Increase in fever for longer than 24 hours, or an increase that is higher than 101 degrees Fahrenheit (normal body temperature is 98 degrees Fahrenheit). The incision may feel warm to the touch.    **CALL YOUR DOCTOR IF ONE OR MORE OF THESE SIGNS / SYMPTOMS SHOULD OCCUR.    ANESTHESIA INFORMATION   ANESTHESIA -- ADULT PATIENTS:  You have received intravenous sedation / general anesthesia, and you may feel drowsy and light-headed for several hours. You may even experience some forgetfulness of the procedure. DO NOT DRIVE A MOTOR VEHICLE or perform any activity requiring complete alertness or coordination until you feel fully awake in about 24-48 hours. Do not drink alcoholic beverages for at least 24 hours. Do not stay alone, you must have a responsible adult available to be with you. You may also experience a dry mouth or nausea for 24 hours. This is a normal side effect and will disappear as the effects of the medication wear off.    REMEMBER   If you experience any difficulty breathing, chest pain, bleeding that you feel is  excessive, persistent nausea or vomiting or for any other concerns:  Call your physician Dr.  Marlyce, Amaryllis NOVAK, MD   at [phone] . You may also ask to have the general doctor on call paged. They are available to you 24 hours a day.      SPECIAL INSTRUCTIONS / COMMENTS   POST-OP-- NORMAL COLONOSCOPY  REST TODAY--DO NOT DRIVE OR OPERATE ELECTRICAL EQUIPMENT OR SIGN LEGAL DOCUMENTS FOR 24 HOURS  RETURN TO SEE DR GENE DUREMDES AS NEEDED  PATIENT ON TEN YEAR COLON LIST    FOLLOW-UP APPOINTMENTS   Please call your surgeon's office at the number listed to schedule a date / time of return for follow-up.     Dr Amaryllis Raman  [phone]

## 2024-04-08 ENCOUNTER — Telehealth (INDEPENDENT_AMBULATORY_CARE_PROVIDER_SITE_OTHER): Payer: Self-pay | Admitting: Surgery

## 2024-05-12 ENCOUNTER — Ambulatory Visit (INDEPENDENT_AMBULATORY_CARE_PROVIDER_SITE_OTHER): Payer: Self-pay | Admitting: Internal Medicine

## 2024-07-12 ENCOUNTER — Ambulatory Visit (INDEPENDENT_AMBULATORY_CARE_PROVIDER_SITE_OTHER): Payer: Self-pay | Admitting: Internal Medicine

## 2024-08-19 ENCOUNTER — Ambulatory Visit (INDEPENDENT_AMBULATORY_CARE_PROVIDER_SITE_OTHER): Payer: Self-pay | Admitting: Internal Medicine
